# Patient Record
Sex: Female | Born: 1980 | Hispanic: No | Marital: Married | Smoking: Former smoker
Health system: Southern US, Community
[De-identification: ages and names within clinical notes are randomized; demographics above are authoritative.]

## PROBLEM LIST (undated history)

## (undated) DIAGNOSIS — Z9189 Other specified personal risk factors, not elsewhere classified: Secondary | ICD-10-CM

## (undated) DIAGNOSIS — E119 Type 2 diabetes mellitus without complications: Secondary | ICD-10-CM

## (undated) DIAGNOSIS — J45909 Unspecified asthma, uncomplicated: Secondary | ICD-10-CM

## (undated) DIAGNOSIS — M129 Arthropathy, unspecified: Secondary | ICD-10-CM

## (undated) DIAGNOSIS — F32A Depression, unspecified: Secondary | ICD-10-CM

## (undated) DIAGNOSIS — E669 Obesity, unspecified: Secondary | ICD-10-CM

## (undated) DIAGNOSIS — R87619 Unspecified abnormal cytological findings in specimens from cervix uteri: Secondary | ICD-10-CM

## (undated) DIAGNOSIS — N926 Irregular menstruation, unspecified: Secondary | ICD-10-CM

## (undated) DIAGNOSIS — K8020 Calculus of gallbladder without cholecystitis without obstruction: Secondary | ICD-10-CM

## (undated) DIAGNOSIS — E7800 Pure hypercholesterolemia, unspecified: Secondary | ICD-10-CM

## (undated) HISTORY — PX: BACK SURGERY: SHX140

## (undated) HISTORY — DX: Type 2 diabetes mellitus without complications: E11.9

## (undated) HISTORY — DX: Obesity, unspecified: E66.9

## (undated) HISTORY — PX: TONSILLECTOMY: SUR1361

## (undated) HISTORY — DX: Unspecified asthma, uncomplicated: J45.909

## (undated) HISTORY — DX: Other specified personal risk factors, not elsewhere classified: Z91.89

## (undated) HISTORY — DX: Pure hypercholesterolemia, unspecified: E78.00

## (undated) HISTORY — PX: CHOLECYSTECTOMY: SHX55

## (undated) HISTORY — DX: Calculus of gallbladder without cholecystitis without obstruction: K80.20

## (undated) HISTORY — DX: Depression, unspecified: F32.A

## (undated) HISTORY — DX: Arthropathy, unspecified: M12.9

## (undated) HISTORY — PX: INNER EAR SURGERY: SHX679

## (undated) HISTORY — DX: Irregular menstruation, unspecified: N92.6

## (undated) HISTORY — DX: Unspecified abnormal cytological findings in specimens from cervix uteri: R87.619

---

## 2008-02-16 ENCOUNTER — Encounter: Payer: Self-pay | Admitting: Endocrinology

## 2008-03-08 DIAGNOSIS — R87619 Unspecified abnormal cytological findings in specimens from cervix uteri: Secondary | ICD-10-CM | POA: Insufficient documentation

## 2008-03-08 DIAGNOSIS — Z9189 Other specified personal risk factors, not elsewhere classified: Secondary | ICD-10-CM

## 2008-03-08 DIAGNOSIS — N926 Irregular menstruation, unspecified: Secondary | ICD-10-CM

## 2008-03-08 HISTORY — DX: Irregular menstruation, unspecified: N92.6

## 2008-03-08 HISTORY — DX: Other specified personal risk factors, not elsewhere classified: Z91.89

## 2008-03-08 HISTORY — DX: Unspecified abnormal cytological findings in specimens from cervix uteri: R87.619

## 2008-03-09 ENCOUNTER — Ambulatory Visit: Payer: Self-pay | Admitting: Endocrinology

## 2008-03-09 DIAGNOSIS — J309 Allergic rhinitis, unspecified: Secondary | ICD-10-CM | POA: Insufficient documentation

## 2008-03-09 DIAGNOSIS — M129 Arthropathy, unspecified: Secondary | ICD-10-CM | POA: Insufficient documentation

## 2008-03-09 DIAGNOSIS — J45909 Unspecified asthma, uncomplicated: Secondary | ICD-10-CM

## 2008-03-09 HISTORY — DX: Arthropathy, unspecified: M12.9

## 2008-03-09 HISTORY — DX: Unspecified asthma, uncomplicated: J45.909

## 2008-03-23 ENCOUNTER — Ambulatory Visit: Payer: Self-pay | Admitting: Endocrinology

## 2008-07-06 ENCOUNTER — Ambulatory Visit (HOSPITAL_COMMUNITY): Admission: RE | Admit: 2008-07-06 | Discharge: 2008-07-06 | Payer: Self-pay | Admitting: Specialist

## 2008-08-03 ENCOUNTER — Ambulatory Visit (HOSPITAL_COMMUNITY): Admission: RE | Admit: 2008-08-03 | Discharge: 2008-08-03 | Payer: Self-pay | Admitting: Specialist

## 2008-08-31 ENCOUNTER — Ambulatory Visit (HOSPITAL_COMMUNITY): Admission: RE | Admit: 2008-08-31 | Discharge: 2008-08-31 | Payer: Self-pay | Admitting: Specialist

## 2008-09-21 ENCOUNTER — Ambulatory Visit (HOSPITAL_COMMUNITY): Admission: RE | Admit: 2008-09-21 | Discharge: 2008-09-21 | Payer: Self-pay | Admitting: Specialist

## 2008-10-14 ENCOUNTER — Ambulatory Visit (HOSPITAL_COMMUNITY): Admission: RE | Admit: 2008-10-14 | Discharge: 2008-10-14 | Payer: Self-pay | Admitting: Specialist

## 2008-12-08 ENCOUNTER — Ambulatory Visit: Payer: Self-pay | Admitting: Endocrinology

## 2008-12-08 DIAGNOSIS — E119 Type 2 diabetes mellitus without complications: Secondary | ICD-10-CM | POA: Insufficient documentation

## 2008-12-08 HISTORY — DX: Type 2 diabetes mellitus without complications: E11.9

## 2009-01-11 ENCOUNTER — Ambulatory Visit: Payer: Self-pay | Admitting: Endocrinology

## 2009-05-02 ENCOUNTER — Ambulatory Visit: Payer: Self-pay | Admitting: Endocrinology

## 2009-08-02 ENCOUNTER — Ambulatory Visit: Payer: Self-pay | Admitting: Endocrinology

## 2009-08-02 LAB — CONVERTED CEMR LAB: Hgb A1c MFr Bld: 9.8 % — ABNORMAL HIGH (ref 4.6–6.5)

## 2009-10-28 ENCOUNTER — Ambulatory Visit: Payer: Self-pay | Admitting: Endocrinology

## 2010-05-01 ENCOUNTER — Encounter: Payer: Self-pay | Admitting: Specialist

## 2010-05-02 ENCOUNTER — Ambulatory Visit: Admit: 2010-05-02 | Payer: Self-pay | Admitting: Endocrinology

## 2010-05-08 ENCOUNTER — Telehealth: Payer: Self-pay | Admitting: Endocrinology

## 2010-05-09 NOTE — Assessment & Plan Note (Signed)
Summary: 3 MOS F/U #/CD   Vital Signs:  Patient profile:   30 year old female Height:      65 inches (165.10 cm) Weight:      261.13 pounds (118.70 kg) O2 Sat:      98 % on Room air Temp:     96.5 degrees F (35.83 degrees C) oral Pulse rate:   80 / minute BP sitting:   120 / 82  (left arm) Cuff size:   large  Vitals Entered By: Josph Macho CMA (May 02, 2009 11:10 AM)  O2 Flow:  Room air CC: 3 MONTH FOLLOW UP/ CF Is Patient Diabetic? Yes   Referring Provider:  Dr. Tressie Ellis, GYN, [place]  CC:  3 MONTH FOLLOW UP/ CF.  History of Present Illness: pt is now 6 mos postpartum.  pt states she feels well in general.  she is not breast-feeding.  no cbg record, but states cbg's are well-controlled, except slightly high in the afternoon.  she says the cost of a medication is a primary concern to her.  she is on oral contraceptives now.  Current Medications (verified): 1)  Zyrtec Allergy 10 Mg Tabs (Cetirizine Hcl) .... Take 1 By Mouth Qd 2)  Freestyle Test  Strp (Glucose Blood) .... Qid 250.03, and Lancets 3)  Metformin Hcl 500 Mg Xr24h-Tab (Metformin Hcl) .... 2 Qam 4)  Sprintec 28 0.25-35 Mg-Mcg Tabs (Norgestimate-Eth Estradiol) .Marland Kitchen.. 1 Daily  Allergies (verified): 1)  ! Bactrim 2)  ! Sulfa 3)  ! Penicillin 4)  ! Barbiturates  Past History:  Past Medical History: DM (ICD-250.00) ARTHRITIS (ICD-716.90) ASTHMA (ICD-493.90) ALLERGIC RHINITIS (ICD-477.9) COLPOSCOPY, HX OF (ICD-V15.89) IRREGULAR MENSTRUAL CYCLE (ICD-626.4) ABNORMAL PAP SMEAR (ICD-795.00) LUNG CANCER, FAMILY HX (ICD-V16.1) DIABETES MELLITUS, FAMILY HX (ICD-V18.0)  Review of Systems  The patient denies hypoglycemia.    Physical Exam  General:  obese.  no distress  Lungs:  Clear to auscultation bilaterally. Normal respiratory effort.  Heart:  Regular rate and rhythm without murmurs or gallops noted. Normal S1,S2.   Additional Exam:   Hemoglobin A1C       [H]  8.4 %     Impression &  Recommendations:  Problem # 1:  DM (ICD-250.00) needs increased rx  Medications Added to Medication List This Visit: 1)  Relion Blood Glucose Test Strp (Glucose blood) .... Two times a day, and lancets 250.00 2)  Glipizide Xl 5 Mg Xr24h-tab (Glipizide) .Marland Kitchen.. 1 each am  Other Orders: TLB-A1C / Hgb A1C (Glycohemoglobin) (83036-A1C) Est. Patient Level III ([address])  Patient Instructions: 1)  pending the test results, please continue the same medications for now 2)  tests are being ordered for you today.  a few days after the test(s), please call [phone] to hear your test results. 3)  return 3 months 4)  check your blood sugar 2 times a day.  vary the time of day when you check, between before the 3 meals, and at bedtime.  also check if you have symptoms of your blood sugar being too high or too low.  please keep a record of the readings and bring it to your next appointment here.  please call us sooner if you are having low blood sugar episodes. 5)  (update: i left message on phone-tree:  increase glipizide-xr to 5 mg each am) Prescriptions: GLIPIZIDE XL 5 MG XR24H-TAB (GLIPIZIDE) 1 each am  #30 x 4   Entered and Authorized by:   Minus Breeding MD   Signed by:  Minus Breeding MD on 05/04/2009   Method used:   Electronically to        [facility]. [address] * (retail)       [address]       [place], [place]  [address]       Ph: [phone]       Fax: [phone]   RxID:   [id number]

## 2010-05-09 NOTE — Assessment & Plan Note (Signed)
Summary: 3 MO ROV /NWS  #   Vital Signs:  Patient profile:   30 year old female Height:      65 inches (165.10 cm) Weight:      261.25 pounds (118.75 kg) O2 Sat:      96 % on Room air Temp:     97.4 degrees F (36.33 degrees C) oral Pulse rate:   89 / minute BP sitting:   128 / 90  (left arm) Cuff size:   large  Vitals Entered By: Josph Macho RMA (August 02, 2009 1:22 PM)  O2 Flow:  Room air CC: 3 month follow up/ CF Is Patient Diabetic? Yes   Referring Provider:  Dr. Tressie Ellis, GYN, [place]  CC:  3 month follow up/ CF.  History of Present Illness: pt is now 6 mos postpartum.  pt states she feels well in general.  she is not breast-feeding.  no cbg record, but states cbg's are improved since last ov.  she says the cost of a medication is a primary concern to her.  she is on oral contraceptives now.  she says she is not considering another pregnancy any time soon.   pt states 1 week of moderate pain at both ears, and associated nasal congestion.  Current Medications (verified): 1)  Metformin Hcl 500 Mg Xr24h-Tab (Metformin Hcl) .... 2 Qam 2)  Sprintec 28 0.25-35 Mg-Mcg Tabs (Norgestimate-Eth Estradiol) .Marland Kitchen.. 1 Daily 3)  Relion Blood Glucose Test  Strp (Glucose Blood) .... Two Times A Day, and Lancets 250.00 4)  Glipizide Xl 5 Mg Xr24h-Tab (Glipizide) .Marland Kitchen.. 1 Each Am  Allergies (verified): 1)  ! Bactrim 2)  ! Sulfa 3)  ! Penicillin 4)  ! Barbiturates  Past History:  Past Medical History: Last updated: 05/02/2009 DM (ICD-250.00) ARTHRITIS (ICD-716.90) ASTHMA (ICD-493.90) ALLERGIC RHINITIS (ICD-477.9) COLPOSCOPY, HX OF (ICD-V15.89) IRREGULAR MENSTRUAL CYCLE (ICD-626.4) ABNORMAL PAP SMEAR (ICD-795.00) LUNG CANCER, FAMILY HX (ICD-V16.1) DIABETES MELLITUS, FAMILY HX (ICD-V18.0)  Review of Systems  The patient denies hypoglycemia and fever.    Physical Exam  General:  obese.   Head:  head: no deformity eyes: no periorbital swelling, no proptosis external  nose and ears are normal mouth: no lesion seen Ears:  both tm's are red Pulses:  dorsalis pedis intact bilat.   Extremities:  no deformity.  no ulcer on the feet.  feet are of normal color and temp.  no edema  Neurologic:  sensation is intact to touch on the feet  Additional Exam:  Hemoglobin A1C       [H]  9.8 %    Impression & Recommendations:  Problem # 1:  DM (ICD-250.00) Assessment Deteriorated  Problem # 2:  uri new  Medications Added to Medication List This Visit: 1)  Doxycycline Hyclate 100 Mg Tabs (Doxycycline hyclate) .Marland Kitchen.. 1 tab two times a day 2)  Glipizide Xl 10 Mg Xr24h-tab (Glipizide) .Marland Kitchen.. 1 once daily 3)  Onglyza 5 Mg Tabs (Saxagliptin hcl) .Marland Kitchen.. 1 each am 4)  Bromocriptine Mesylate 2.5 Mg Tabs (Bromocriptine mesylate) .... 1/2 tab at bedtime  Other Orders: TLB-A1C / Hgb A1C (Glycohemoglobin) (83036-A1C) Est. Patient Level IV ([address])  Patient Instructions: 1)  tests are being ordered for you today.  please call [phone] to hear your test results. 2)  pending the test results, please continue the same medications for now. 3)  here is a discount card for "onglyza," in case today's blood test indicates the need for it. 4)  check your blood sugar 2 times  a day.  vary the time of day when you check, between before the 3 meals, and at bedtime.  also check if you have symptoms of your blood sugar being too high or too low.  please keep a record of the readings and bring it to your next appointment here.  please call us sooner if you are having low blood sugar episodes. 5)  Please schedule a follow-up appointment in 3 months. 6)  doxycycline 100 mg two times a day 7)  take loratadine-d (non-prescription) as needed for congestion 8)  (update: i left message on phone-tree:  increase glipizide-xl to 10 mg each am.  add onglyza 5 mg qam.  add bromocriptine 1/2 of 2.5 mg at bedtime). Prescriptions: BROMOCRIPTINE MESYLATE 2.5 MG TABS (BROMOCRIPTINE MESYLATE) 1/2 tab at bedtime   #15 x 11   Entered and Authorized by:   Minus Breeding MD   Signed by:   Minus Breeding MD on 08/02/2009   Method used:   Electronically to        [facility]. [address] * (retail)       [address]       [place], [place]  [address]       Ph: [phone]       Fax: [phone]   RxID:   [id number] ONGLYZA 5 MG TABS (SAXAGLIPTIN HCL) 1 each am  #30 x 11   Entered and Authorized by:   Minus Breeding MD   Signed by:   Minus Breeding MD on 08/02/2009   Method used:   Electronically to        [facility]. [address] * (retail)       [address]       [place], [place]  [address]       Ph: [phone]       Fax: [phone]   RxID:   [id number] GLIPIZIDE XL 10 MG XR24H-TAB (GLIPIZIDE) 1 once daily  #30 x 11   Entered and Authorized by:   Minus Breeding MD   Signed by:   Minus Breeding MD on 08/02/2009   Method used:   Electronically to        [facility]. [address] * (retail)       [address]       [place], [place]  [address]       Ph: [phone]       Fax: [phone]   RxID:   [id number] DOXYCYCLINE HYCLATE 100 MG TABS (DOXYCYCLINE HYCLATE) 1 tab two times a day  #14 x 0   Entered and Authorized by:   Minus Breeding MD   Signed by:   Minus Breeding MD on 08/02/2009   Method used:   Electronically to        [facility]. [address] * (retail)       [address]       [place], [place]  [address]       Ph: [phone]       Fax: [phone]   RxID:   [id number]

## 2010-05-09 NOTE — Assessment & Plan Note (Signed)
Summary: 3 MO ROV /NWS   Vital Signs:  Patient profile:   30 year old female Height:      65 inches (165.10 cm) Weight:      263.50 pounds (119.77 kg) BMI:     44.01 O2 Sat:      97 % on Room air Temp:     97.7 degrees F (36.50 degrees C) oral Pulse rate:   75 / minute BP sitting:   128 / 78  (left arm) Cuff size:   large  Vitals Entered By: Brenton Grills MA (October 28, 2009 3:16 PM)  O2 Flow:  Room air CC: 3 mo F/U appt/aj Is Patient Diabetic? Yes   Referring Provider:  Dr. Tressie Ellis, GYN, [place]  CC:  3 mo F/U appt/aj.  History of Present Illness: pt states she feels well in general, except for fatigue.  no cbg record, but states cbg's vary from 140-240.  it is in general higher in am than later in the day.  she say her diet and exercise are good.  she feels the meds are making her sleepy.    Current Medications (verified): 1)  Metformin Hcl 500 Mg Xr24h-Tab (Metformin Hcl) .... 2 Qam 2)  Sprintec 28 0.25-35 Mg-Mcg Tabs (Norgestimate-Eth Estradiol) .Marland Kitchen.. 1 Daily 3)  Relion Blood Glucose Test  Strp (Glucose Blood) .... Two Times A Day, and Lancets 250.00 4)  Doxycycline Hyclate 100 Mg Tabs (Doxycycline Hyclate) .Marland Kitchen.. 1 Tab Two Times A Day 5)  Glipizide Xl 10 Mg Xr24h-Tab (Glipizide) .Marland Kitchen.. 1 Once Daily 6)  Onglyza 5 Mg Tabs (Saxagliptin Hcl) .Marland Kitchen.. 1 Each Am 7)  Bromocriptine Mesylate 2.5 Mg Tabs (Bromocriptine Mesylate) .... 1/2 Tab At Bedtime  Allergies (verified): 1)  ! Bactrim 2)  ! Sulfa 3)  ! Penicillin 4)  ! Barbiturates  Past History:  Past Medical History: Last updated: 05/02/2009 DM (ICD-250.00) ARTHRITIS (ICD-716.90) ASTHMA (ICD-493.90) ALLERGIC RHINITIS (ICD-477.9) COLPOSCOPY, HX OF (ICD-V15.89) IRREGULAR MENSTRUAL CYCLE (ICD-626.4) ABNORMAL PAP SMEAR (ICD-795.00) LUNG CANCER, FAMILY HX (ICD-V16.1) DIABETES MELLITUS, FAMILY HX (ICD-V18.0)  Review of Systems  The patient denies hypoglycemia.    Physical Exam  General:  morbidly obese.  no  distress  Neck:  Supple without thyroid enlargement or tenderness.  Additional Exam:  Hemoglobin A1C       [H]  8.8 %     Impression & Recommendations:  Problem # 1:  DIABETES MELLITUS, FAMILY HX (ICD-V18.0) Assessment Improved  Medications Added to Medication List This Visit: 1)  Metformin Hcl 500 Mg Xr24h-tab (Metformin hcl) .... 2 tabs two times a day 2)  Bromocriptine Mesylate 2.5 Mg Tabs (Bromocriptine mesylate) .Marland Kitchen.. 1 tab at bedtime  Other Orders: TLB-A1C / Hgb A1C (Glycohemoglobin) (83036-A1C) Est. Patient Level III ([address])  Patient Instructions: 1)  blood tests are being ordered for you today.  please call [phone] to hear your test results. 2)  pending the test results, please: 3)  increase bromocriptine to 2.5 mg at bedtime 4)  increase metformin to 2x500 mg two times a day. 5)  based on the test result, it is possible that another medication called "actos"  would be needed to get your blood sugar in a good range. 6)  check your blood sugar 2 times a day.  vary the time of day when you check, between before the 3 meals, and at bedtime.  also check if you have symptoms of your blood sugar being too high or too low.  please keep a record of the readings  and bring it to your next appointment here.  please call us sooner if you are having low blood sugar episodes. Prescriptions: BROMOCRIPTINE MESYLATE 2.5 MG TABS (BROMOCRIPTINE MESYLATE) 1 tab at bedtime  #30 x 11   Entered and Authorized by:   Minus Breeding MD   Signed by:   Minus Breeding MD on 10/28/2009   Method used:   Electronically to        [facility]. [address] * (retail)       [address]       [place], [place]  [address]       Ph: [phone]       Fax: [phone]   RxID:   [id number] METFORMIN HCL 500 MG XR24H-TAB (METFORMIN HCL) 2 tabs two times a day  #120 x 11   Entered and Authorized by:   Minus Breeding MD   Signed by:   Minus Breeding MD on 10/28/2009   Method used:   Electronically to         [facility]. [address] * (retail)       [address]       [place], [place]  [address]       Ph: [phone]       Fax: [phone]   RxID:   [id number]

## 2010-05-17 NOTE — Progress Notes (Signed)
Summary: OV due  Phone Note Outgoing Call Call back at [facility] Phone [phone]   Call placed by: Brenton Grills CMA Duncan Dull),  May 08, 2010 4:42 PM Call placed to: Patient Details for Reason: OV due Summary of Call: Per MD, pt is due for F/U OV. Last OV 10/2009. Left detailed message for pt to schedule OV with SAE.

## 2010-07-05 ENCOUNTER — Encounter: Payer: Self-pay | Admitting: Endocrinology

## 2010-07-05 ENCOUNTER — Ambulatory Visit (INDEPENDENT_AMBULATORY_CARE_PROVIDER_SITE_OTHER): Payer: BC Managed Care – PPO | Admitting: Endocrinology

## 2010-07-05 ENCOUNTER — Other Ambulatory Visit (INDEPENDENT_AMBULATORY_CARE_PROVIDER_SITE_OTHER): Payer: BC Managed Care – PPO

## 2010-07-05 VITALS — BP 122/76 | HR 92 | Temp 98.0°F | Ht 65.0 in | Wt 263.6 lb

## 2010-07-05 DIAGNOSIS — E119 Type 2 diabetes mellitus without complications: Secondary | ICD-10-CM

## 2010-07-05 MED ORDER — DOXYCYCLINE HYCLATE 100 MG PO TABS: 100.0000 mg | ORAL_TABLET | Freq: Two times a day (BID) | ORAL | Status: AC

## 2010-07-05 NOTE — Patient Instructions (Addendum)
blood tests are being ordered for you today.  please call [phone] to hear your test results. If today's blood test is high, options are addition of "actos," addition of "welchol," or changing onglyza to "victoza." You should consider weight-loss surgery.  Please call if you want to pursue this.   i have sent a prescription for an antibiotic to [facility] in [place].   Also, you should consider "loratadine-d" (non-prescription) for congestion.

## 2010-07-05 NOTE — Progress Notes (Signed)
  Subjective:    Patient ID: Judith Ray, female    DOB: 06-Apr-1981, 30 y.o.   MRN: [id number]  HPI Pt states 1 week of moderate pain in the left ear, and assoc nasal congestion.   pt says her cbg's continue to be in the 200's.  She stopped the parlodel, because she understands this would offset the contraceptive effect of her birth control pills.   Past Medical History  Diagnosis Date  . DM 12/08/2008  . ASTHMA 03/09/2008  . Irregular menstrual cycle 03/08/2008  . ARTHRITIS 03/09/2008  . ABNORMAL PAP SMEAR 03/08/2008  . COLPOSCOPY, HX OF 03/08/2008   Past Surgical History  Procedure Date  . Tonsillectomy   . Cholecystectomy     reports that she quit smoking about 10 years ago. She does not have any smokeless tobacco history on file. She reports that she does not drink alcohol or use illicit drugs. family history includes Diabetes in her mother. Allergies  Allergen Reactions  . Amoxicillin   . Azithromycin   . Barbiturates   . Penicillins   . Sulfamethoxazole W/Trimethoprim   . Sulfonamide Derivatives     Review of Systems Denies weight change and fever.    Objective:   Physical Exam GENERAL: no distress. HEAD: head: no deformity eyes: no periorbital swelling, no proptosis external nose and ears are normal mouth: no lesion seen Tm's:  Slight red on the left, and normal on the right.   Pulses: dorsalis pedis intact bilat.   Feet: no deformity.  no ulcer on the feet.  feet are of normal color and temp.  no edema Neuro: sensation is intact to touch on the feet.      Lab Results  Component Value Date   HGBA1C 8.5* 07/05/2010      Assessment & Plan:  Dm.  needs increased rx Glenford Peers, new problem

## 2010-08-07 ENCOUNTER — Other Ambulatory Visit: Payer: Self-pay | Admitting: Endocrinology

## 2010-08-10 ENCOUNTER — Other Ambulatory Visit: Payer: Self-pay | Admitting: Endocrinology

## 2010-08-10 MED ORDER — SAXAGLIPTIN HCL 5 MG PO TABS: ORAL_TABLET | ORAL | Status: DC

## 2010-08-10 MED ORDER — GLIPIZIDE ER 10 MG PO TB24: ORAL_TABLET | ORAL | Status: DC

## 2010-08-10 NOTE — Telephone Encounter (Signed)
R'cd fax from [facility] in [place] for refill of pt's Onglyza and Glipizide rx.  Last OV-07/05/2010  Last Filled-07/07/2010

## 2010-09-02 IMAGING — US US OB FOLLOW-UP
1 series · 18 of 28 positions shown · non-contrast
Comparison: none

OBSTETRICAL ULTRASOUND:
 This ultrasound was performed in The [HOSPITAL], and the [facility] report will be stored to [REDACTED] PACS.

[Series 1: us ob follow-up · 18 of 37 slices shown]
[im 1/37]
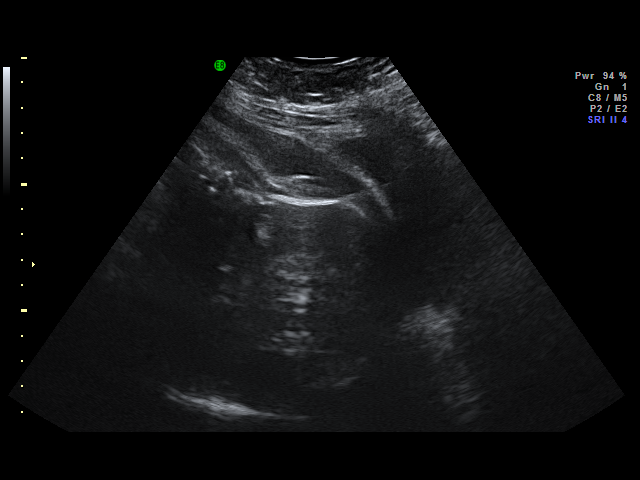
[im 3/37]
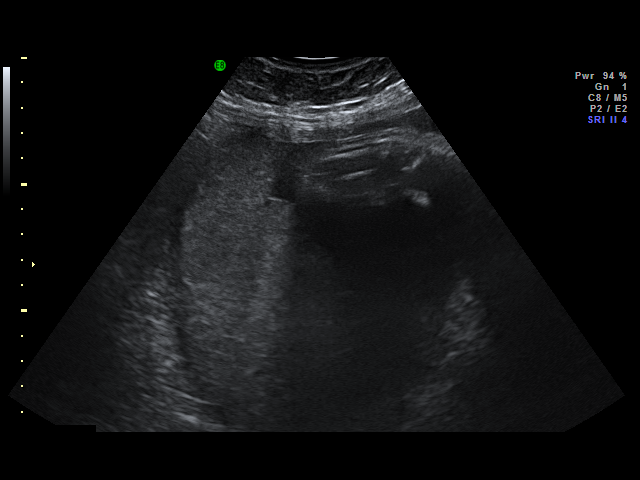
[im 5/37]
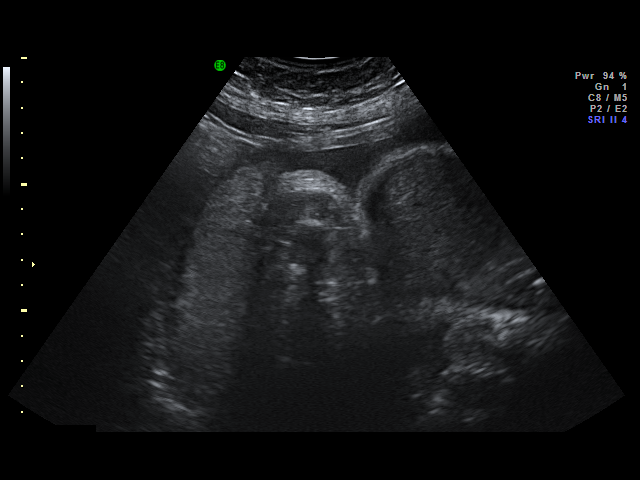
[im 7/37]
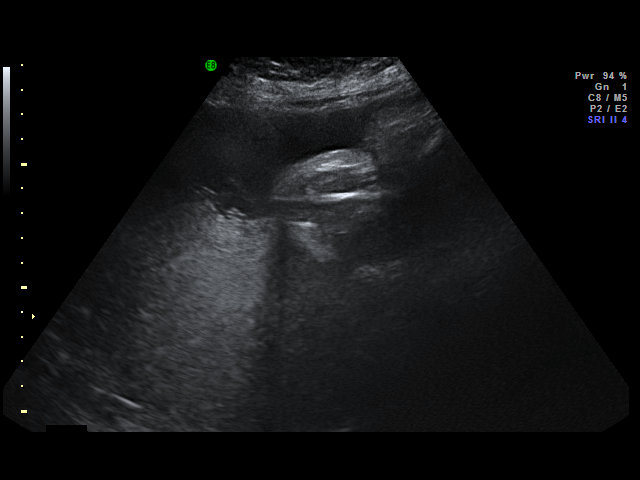
[im 10/37]
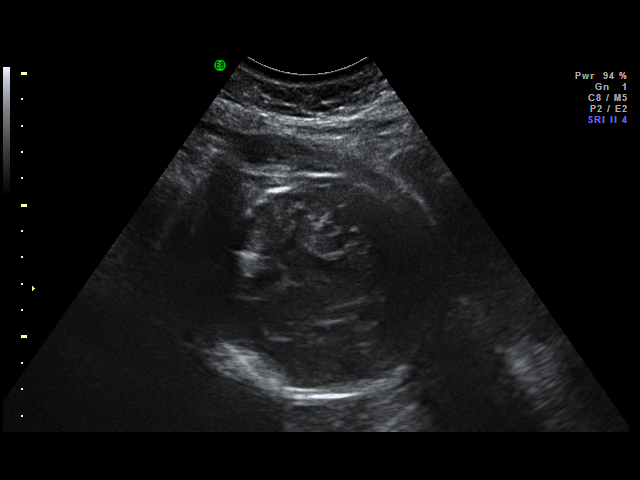
[im 11/37]
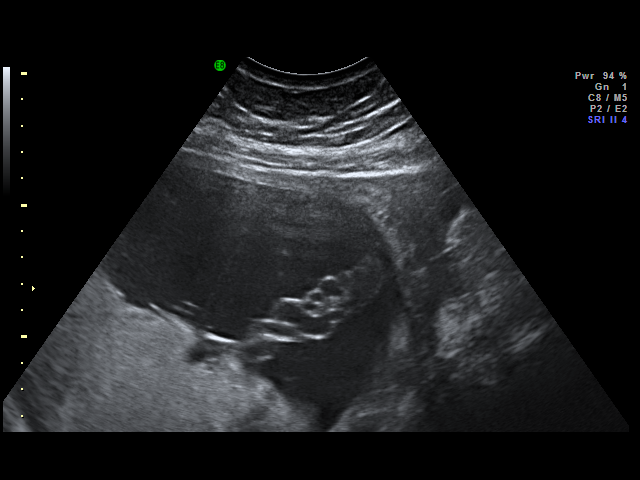
[im 14/37]
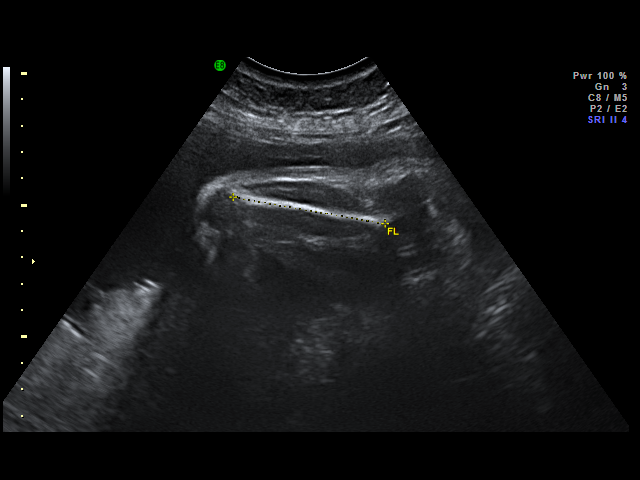
[im 15/37]
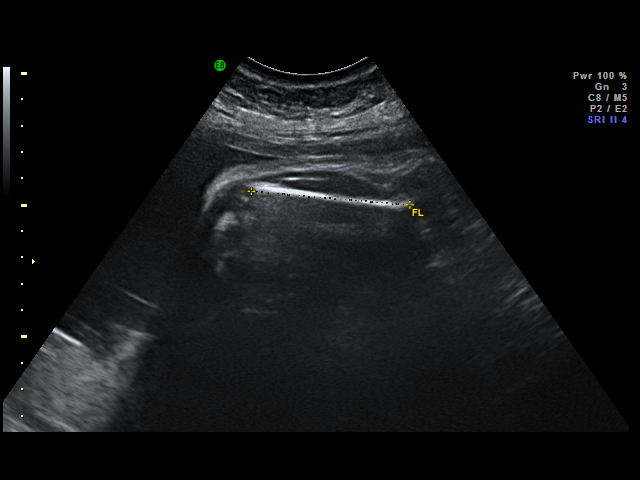
[im 18/37]
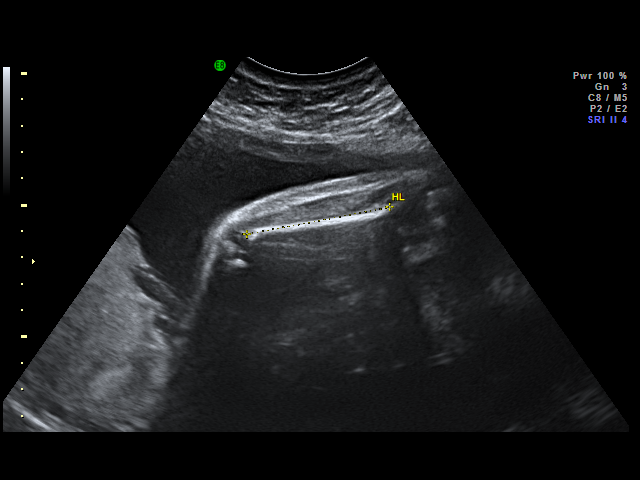
[im 19/37]
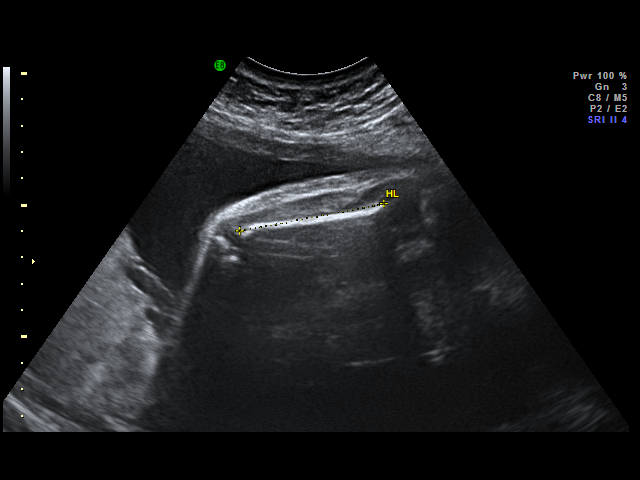
[im 22/37]
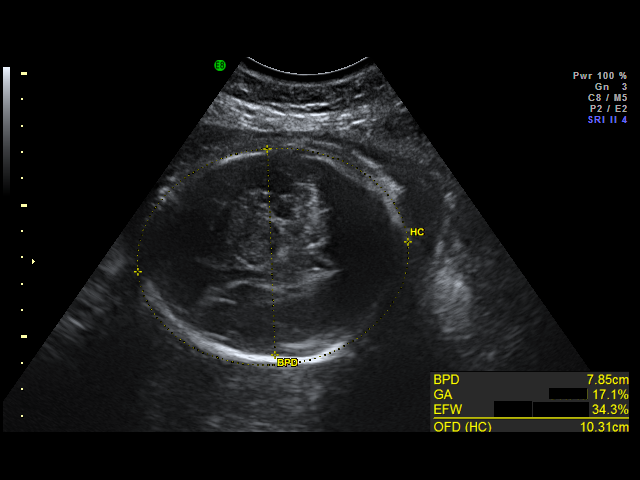
[im 23/37]
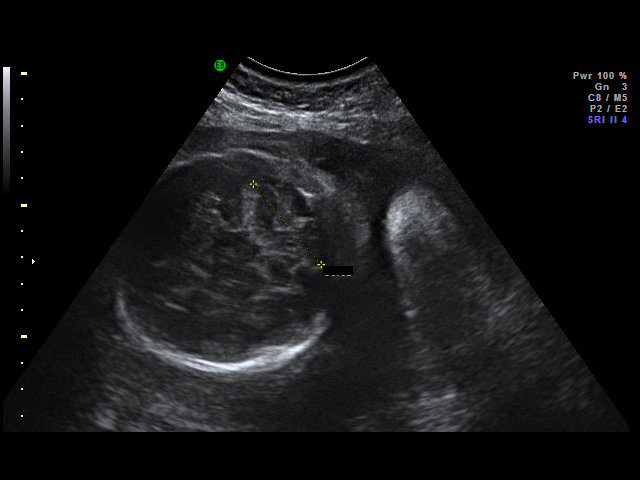
[im 26/37]
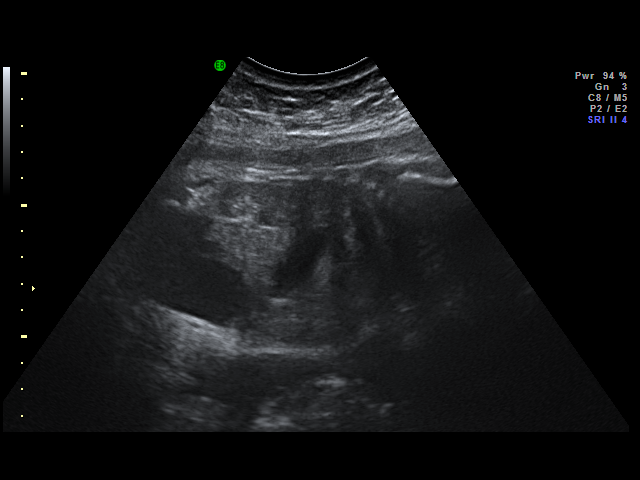
[im 29/37]
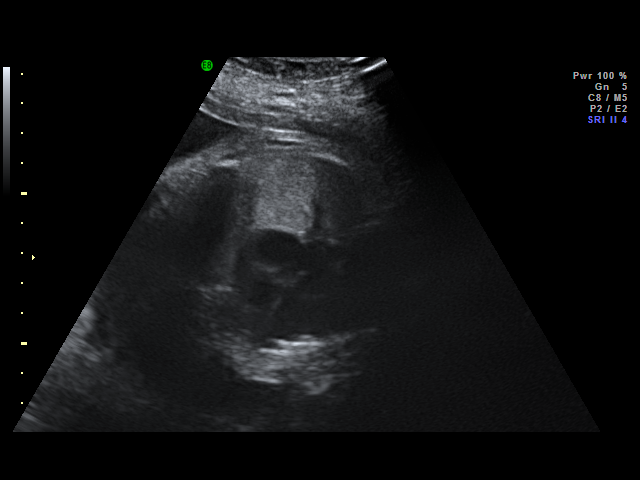
[im 30/37]
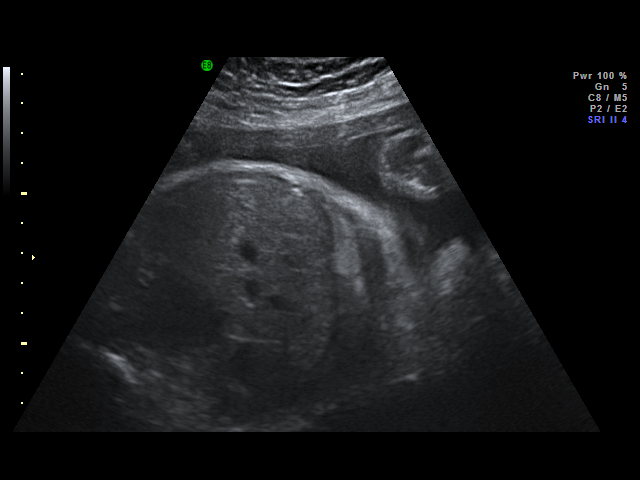
[im 33/37]
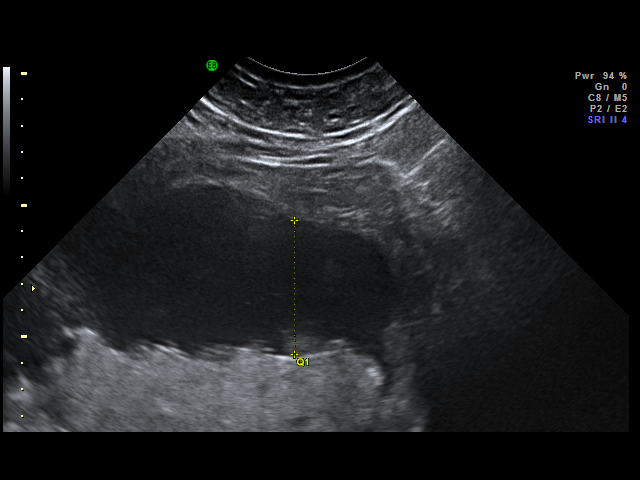
[im 34/37]
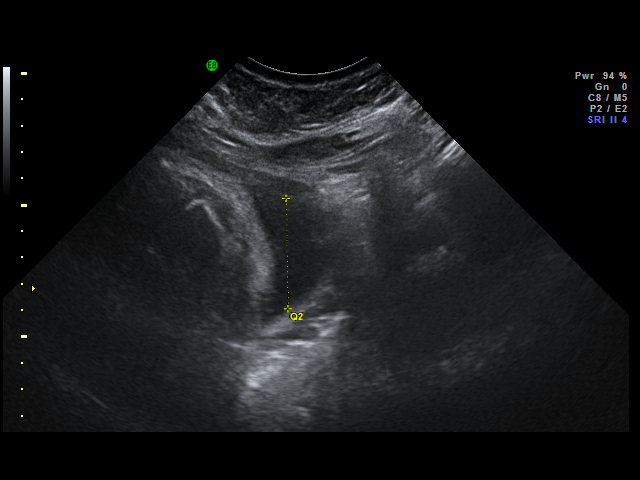
[im 37/37]
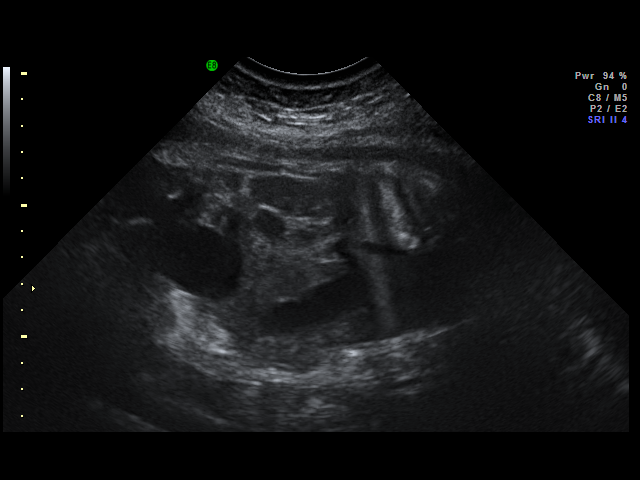

[18 of 28 positions shown; findings below may reference images not displayed]

IMPRESSION: [facility] has also been faxed to the ordering physician.

## 2010-11-06 ENCOUNTER — Other Ambulatory Visit: Payer: Self-pay | Admitting: Endocrinology

## 2011-02-05 ENCOUNTER — Other Ambulatory Visit: Payer: Self-pay | Admitting: Endocrinology

## 2011-02-06 ENCOUNTER — Telehealth: Payer: Self-pay

## 2011-02-06 NOTE — Telephone Encounter (Signed)
Pharmacy called requesting clarification on pt's Glipizide 10 mg. Should she be on both the regular and XR. Pt jas told pharmacy that she takes one in the morning and one in the evening per SAE's instruction, please advise.

## 2011-02-06 NOTE — Telephone Encounter (Signed)
Pharmacy informed. Attempt to contact patient to inform-no answer, no answering machine.

## 2011-02-06 NOTE — Telephone Encounter (Signed)
Done

## 2011-02-06 NOTE — Telephone Encounter (Signed)
Take just xr, not the immediate release

## 2011-05-04 ENCOUNTER — Other Ambulatory Visit: Payer: Self-pay | Admitting: Endocrinology

## 2011-06-10 ENCOUNTER — Other Ambulatory Visit: Payer: Self-pay | Admitting: Endocrinology

## 2011-08-05 ENCOUNTER — Other Ambulatory Visit: Payer: Self-pay | Admitting: Endocrinology

## 2011-11-05 ENCOUNTER — Encounter: Payer: Self-pay | Admitting: Endocrinology

## 2011-11-05 ENCOUNTER — Other Ambulatory Visit (INDEPENDENT_AMBULATORY_CARE_PROVIDER_SITE_OTHER): Payer: BC Managed Care – PPO

## 2011-11-05 ENCOUNTER — Ambulatory Visit (INDEPENDENT_AMBULATORY_CARE_PROVIDER_SITE_OTHER): Payer: BC Managed Care – PPO | Admitting: Endocrinology

## 2011-11-05 VITALS — BP 142/80 | HR 78 | Temp 97.2°F | Ht 67.0 in | Wt 247.2 lb

## 2011-11-05 DIAGNOSIS — E119 Type 2 diabetes mellitus without complications: Secondary | ICD-10-CM

## 2011-11-05 DIAGNOSIS — Z79899 Other long term (current) drug therapy: Secondary | ICD-10-CM | POA: Insufficient documentation

## 2011-11-05 DIAGNOSIS — Z0000 Encounter for general adult medical examination without abnormal findings: Secondary | ICD-10-CM | POA: Insufficient documentation

## 2011-11-05 DIAGNOSIS — M129 Arthropathy, unspecified: Secondary | ICD-10-CM

## 2011-11-05 LAB — CBC WITH DIFFERENTIAL/PLATELET
Basophils Relative: 0.4 % (ref 0.0–3.0)
Eosinophils Relative: 3.1 % (ref 0.0–5.0)
Lymphocytes Relative: 30.2 % (ref 12.0–46.0)
Neutrophils Relative %: 60.9 % (ref 43.0–77.0)
Platelets: 296 10*3/uL (ref 150.0–400.0)
RBC: 4.58 Mil/uL (ref 3.87–5.11)
WBC: 11.9 10*3/uL — ABNORMAL HIGH (ref 4.5–10.5)

## 2011-11-05 LAB — URINALYSIS, ROUTINE W REFLEX MICROSCOPIC
Ketones, ur: NEGATIVE
Urine Glucose: 250
pH: 6 (ref 5.0–8.0)

## 2011-11-05 LAB — LIPID PANEL
Cholesterol: 184 mg/dL (ref 0–200)
Total CHOL/HDL Ratio: 4
Triglycerides: 263 mg/dL — ABNORMAL HIGH (ref 0.0–149.0)

## 2011-11-05 LAB — BASIC METABOLIC PANEL
Chloride: 100 mEq/L (ref 96–112)
Creatinine, Ser: 0.6 mg/dL (ref 0.4–1.2)
GFR: 122.09 mL/min (ref >60.00)
Potassium: 4.1 mEq/L (ref 3.5–5.1)

## 2011-11-05 LAB — MICROALBUMIN / CREATININE URINE RATIO
Creatinine,U: 323.1 mg/dL
Microalb, Ur: 2.3 mg/dL — ABNORMAL HIGH (ref 0.0–1.9)

## 2011-11-05 LAB — TSH: TSH: 5.08 u[IU]/mL (ref 0.35–5.50)

## 2011-11-05 LAB — HEPATIC FUNCTION PANEL
ALT: 56 U/L — ABNORMAL HIGH (ref 0–35)
Alkaline Phosphatase: 45 U/L (ref 39–117)
Bilirubin, Direct: 0.1 mg/dL (ref 0.0–0.3)
Total Bilirubin: 0.5 mg/dL (ref 0.3–1.2)
Total Protein: 7.1 g/dL (ref 6.0–8.3)

## 2011-11-05 LAB — HEMOGLOBIN A1C: Hgb A1c MFr Bld: 10.4 % — ABNORMAL HIGH (ref 4.6–6.5)

## 2011-11-05 LAB — LDL CHOLESTEROL, DIRECT: Direct LDL: 105.9 mg/dL

## 2011-11-05 MED ORDER — GABAPENTIN 300 MG PO CAPS: 300.0000 mg | ORAL_CAPSULE | Freq: Three times a day (TID) | ORAL | Status: AC

## 2011-11-05 NOTE — Progress Notes (Signed)
Subjective:    Patient ID: Judith Ray, female    DOB: 09-Mar-1981, 31 y.o.   MRN: [id number]  HPI Pt states few mos of intermittent moderate cramps of the feet, and assoc pain.  sxs are worse in the context of exertion, or exposure to cold.   Past Medical History  Diagnosis Date  . DM 12/08/2008  . ASTHMA 03/09/2008  . Irregular menstrual cycle 03/08/2008  . ARTHRITIS 03/09/2008  . ABNORMAL PAP SMEAR 03/08/2008  . COLPOSCOPY, HX OF 03/08/2008    Past Surgical History  Procedure Date  . Tonsillectomy   . Cholecystectomy     History   Social History  . Marital Status: Married    Spouse Name: N/A    Number of Children: N/A  . Years of Education: N/A   Occupational History  . Not on file.   Social History Main Topics  . Smoking status: Former Smoker    Quit date: 07/04/2000  . Smokeless tobacco: Not on file  . Alcohol Use: No  . Drug Use: No  . Sexually Active: Not on file   Other Topics Concern  . Not on file   Social History Narrative  . No narrative on file    Current Outpatient Prescriptions on File Prior to Visit  Medication Sig Dispense Refill  . glipiZIDE (GLUCOTROL XL) 10 MG 24 hr tablet TAKE ONE TABLET BY MOUTH EVERY DAY  30 tablet  5  . glucose blood (RELION GLUCOSE TEST STRIPS) test strip Two times a day dx 250.00       . metFORMIN (GLUCOPHAGE-XR) 500 MG 24 hr tablet TAKE TWO TABLETS BY MOUTH TWICE DAILY  120 tablet  5  . norgestimate-ethinyl estradiol (ORTHO-CYCLEN) 0.25-35 MG-MCG per tablet Take 1 tablet by mouth daily.        . ONGLYZA 5 MG TABS tablet TAKE ONE TABLET BY MOUTH IN THE MORNING  30 tablet  3  . gabapentin (NEURONTIN) 300 MG capsule Take 1 capsule (300 mg total) by mouth 3 (three) times daily.  30 capsule  11    Allergies  Allergen Reactions  . Amoxicillin   . Azithromycin   . Barbiturates   . Penicillins   . Sulfamethoxazole W-Trimethoprim   . Sulfonamide Derivatives     Family History  Problem Relation Age of Onset  . Diabetes  Mother     "diet-controlled"   BP 142/80  Pulse 78  Temp 97.2 F (36.2 C) (Oral)  Ht 5\' 7"  (1.702 m)  Wt 247 lb 4 oz (112.152 kg)  BMI 38.72 kg/m2  SpO2 98%  Review of Systems Denies numbness and edema.      Objective:   Physical Exam VITAL SIGNS:  See vs page. GENERAL: no distress.  Pulses: dorsalis pedis intact bilat.   Feet: no deformity.  no ulcer on the feet.  feet are of normal color and temp.  no edema.   Neuro: sensation is intact to touch on the feet.     Lab Results  Component Value Date   WBC 11.9* 11/05/2011   HGB 13.9 11/05/2011   HCT 41.5 11/05/2011   PLT 296.0 11/05/2011   GLUCOSE 297* 11/05/2011   CHOL 184 11/05/2011   TRIG 263.0* 11/05/2011   HDL 44.90 11/05/2011   LDLDIRECT 105.9 11/05/2011   ALT 56* 11/05/2011   AST 24 11/05/2011   NA 136 11/05/2011   K 4.1 11/05/2011   CL 100 11/05/2011   CREATININE 0.6 11/05/2011   BUN 12 11/05/2011  CO2 29 11/05/2011   TSH 5.08 11/05/2011   HGBA1C 10.4* 11/05/2011   MICROALBUR 2.3* 11/05/2011      Assessment & Plan:  DM.   Poor control elev hepatic transaminase, usually due to nash Dyslipidemia, needs increased rx Foot pain, possibly neuropathic

## 2011-11-05 NOTE — Patient Instructions (Addendum)
blood tests are being requested for you today.  You will receive a letter with results. i have sent a prescription to your pharmacy, for a pill against the pain.   Please come back for a follow-up appointment in 6 months

## 2011-11-06 ENCOUNTER — Encounter: Payer: Self-pay | Admitting: Endocrinology

## 2011-11-07 ENCOUNTER — Telehealth: Payer: Self-pay | Admitting: *Deleted

## 2011-11-07 NOTE — Telephone Encounter (Signed)
Called pt to inform of lab results, pt informed via VM and to callback office with any questions/concerns (letter also mailed to pt).  

## 2011-12-06 ENCOUNTER — Other Ambulatory Visit: Payer: Self-pay | Admitting: Endocrinology

## 2012-01-17 ENCOUNTER — Other Ambulatory Visit: Payer: Self-pay | Admitting: Endocrinology

## 2012-06-19 ENCOUNTER — Other Ambulatory Visit: Payer: Self-pay | Admitting: Endocrinology

## 2012-07-20 ENCOUNTER — Other Ambulatory Visit: Payer: Self-pay | Admitting: Endocrinology

## 2012-07-21 ENCOUNTER — Other Ambulatory Visit: Payer: Self-pay | Admitting: *Deleted

## 2012-07-21 MED ORDER — SAXAGLIPTIN HCL 5 MG PO TABS: 5.0000 mg | ORAL_TABLET | Freq: Every day | ORAL | Status: DC

## 2012-07-21 MED ORDER — GLIPIZIDE ER 10 MG PO TB24: 10.0000 mg | ORAL_TABLET | Freq: Every day | ORAL | Status: DC

## 2012-07-21 MED ORDER — METFORMIN HCL ER 500 MG PO TB24: 500.0000 mg | ORAL_TABLET | Freq: Every day | ORAL | Status: DC

## 2012-10-09 ENCOUNTER — Other Ambulatory Visit: Payer: Self-pay | Admitting: Endocrinology

## 2012-10-29 ENCOUNTER — Other Ambulatory Visit: Payer: Self-pay | Admitting: Endocrinology

## 2013-02-20 ENCOUNTER — Other Ambulatory Visit: Payer: Self-pay | Admitting: Endocrinology

## 2013-03-10 ENCOUNTER — Telehealth: Payer: Self-pay

## 2013-03-10 NOTE — Telephone Encounter (Signed)
Error

## 2014-05-03 ENCOUNTER — Ambulatory Visit: Payer: Self-pay | Admitting: Endocrinology

## 2014-05-12 ENCOUNTER — Ambulatory Visit (INDEPENDENT_AMBULATORY_CARE_PROVIDER_SITE_OTHER): Payer: BLUE CROSS/BLUE SHIELD | Admitting: Endocrinology

## 2014-05-12 ENCOUNTER — Encounter: Payer: Self-pay | Admitting: Endocrinology

## 2014-05-12 VITALS — BP 130/90 | HR 84 | Temp 97.9°F | Ht 67.0 in | Wt 246.0 lb

## 2014-05-12 DIAGNOSIS — E119 Type 2 diabetes mellitus without complications: Secondary | ICD-10-CM

## 2014-05-12 LAB — BASIC METABOLIC PANEL
BUN: 14 mg/dL (ref 6–23)
CALCIUM: 9.6 mg/dL (ref 8.4–10.5)
CO2: 28 mEq/L (ref 19–32)
CREATININE: 0.68 mg/dL (ref 0.40–1.20)
Chloride: 99 mEq/L (ref 96–112)
GFR: 104.12 mL/min (ref >60.00)
GLUCOSE: 362 mg/dL — AB (ref 70–99)
POTASSIUM: 3.9 meq/L (ref 3.5–5.1)
Sodium: 134 mEq/L — ABNORMAL LOW (ref 135–145)

## 2014-05-12 LAB — HEMOGLOBIN A1C: Hgb A1c MFr Bld: 14.5 % — ABNORMAL HIGH (ref 4.6–6.5)

## 2014-05-12 LAB — LIPID PANEL
CHOL/HDL RATIO: 5
Cholesterol: 198 mg/dL (ref 0–200)
HDL: 41.1 mg/dL (ref >39.00)
NONHDL: 156.9
Triglycerides: 338 mg/dL — ABNORMAL HIGH (ref 0.0–149.0)
VLDL: 67.6 mg/dL — ABNORMAL HIGH (ref 0.0–40.0)

## 2014-05-12 LAB — LDL CHOLESTEROL, DIRECT: Direct LDL: 115 mg/dL

## 2014-05-12 LAB — MICROALBUMIN / CREATININE URINE RATIO
CREATININE, U: 56.9 mg/dL
Microalb Creat Ratio: 1.2 mg/g (ref 0.0–30.0)
Microalb, Ur: <0.7 mg/dL (ref 0.0–1.9)

## 2014-05-12 LAB — TSH: TSH: 3.57 u[IU]/mL (ref 0.35–4.50)

## 2014-05-12 MED ORDER — LINAGLIPTIN 5 MG PO TABS: 5.0000 mg | ORAL_TABLET | Freq: Every day | ORAL | Status: DC

## 2014-05-12 MED ORDER — CANAGLIFLOZIN 300 MG PO TABS: 300.0000 mg | ORAL_TABLET | Freq: Every day | ORAL | Status: DC

## 2014-05-12 MED ORDER — METFORMIN HCL ER 500 MG PO TB24: ORAL_TABLET | ORAL | Status: DC

## 2014-05-12 MED ORDER — GLIPIZIDE ER 10 MG PO TB24: 10.0000 mg | ORAL_TABLET | Freq: Every day | ORAL | Status: DC

## 2014-05-12 MED ORDER — ATORVASTATIN CALCIUM 10 MG PO TABS: 10.0000 mg | ORAL_TABLET | Freq: Every day | ORAL | Status: DC

## 2014-05-12 NOTE — Patient Instructions (Addendum)
blood tests are being requested for you today.  We'll let you know about the results. If it is high, we can add additional pills.   check your blood sugar once a day.  vary the time of day when you check, between before the 3 meals, and at bedtime.  also check if you have symptoms of your blood sugar being too high or too low.  please keep a record of the readings and bring it to your next appointment here.  You can write it on any piece of paper.  please call us sooner if your blood sugar goes below 70, or if you have a lot of readings over 200. Please come back for a follow-up appointment in 3 months.   In view of your medical condition, you should avoid pregnancy until we have decided it is safe.

## 2014-05-12 NOTE — Progress Notes (Signed)
Subjective:    Patient ID: Judith Ray, female    DOB: 1981/03/07, 34 y.o.   MRN: [id number]  HPI Pt returns for f/u of diabetes mellitus: DM type: 2 Dx'ed: 2010, during a pregnancy Complications: none Therapy: 3 oral meds DKA: never Severe hypoglycemia: never Pancreatitis: never Other: she has never been on insulin, except during the 2010 pregnancy;  Interval history: no cbg record, but states cbg's are in the 300's.  pt states she feels well in general.  Past Medical History  Diagnosis Date  . DM 12/08/2008  . ASTHMA 03/09/2008  . Irregular menstrual cycle 03/08/2008  . ARTHRITIS 03/09/2008  . ABNORMAL PAP SMEAR 03/08/2008  . COLPOSCOPY, HX OF 03/08/2008    Past Surgical History  Procedure Laterality Date  . Tonsillectomy    . Cholecystectomy      History   Social History  . Marital Status: Married    Spouse Name: N/A    Number of Children: N/A  . Years of Education: N/A   Occupational History  . Not on file.   Social History Main Topics  . Smoking status: Former Smoker    Quit date: 07/04/2000  . Smokeless tobacco: Not on file  . Alcohol Use: No  . Drug Use: No  . Sexual Activity: Not on file   Other Topics Concern  . Not on file   Social History Narrative    Current Outpatient Prescriptions on File Prior to Visit  Medication Sig Dispense Refill  . glucose blood (RELION GLUCOSE TEST STRIPS) test strip Two times a day dx 250.00     . gabapentin (NEURONTIN) 300 MG capsule Take 1 capsule (300 mg total) by mouth 3 (three) times daily. 30 capsule 11   No current facility-administered medications on file prior to visit.    Allergies  Allergen Reactions  . Amoxicillin   . Azithromycin   . Barbiturates   . Penicillins   . Sulfamethoxazole-Trimethoprim   . Sulfonamide Derivatives     Family History  Problem Relation Age of Onset  . Diabetes Mother     "diet-controlled"    BP 130/90 mmHg  Pulse 84  Temp(Src) 97.9 F (36.6 C) (Oral)  Ht   (1.702 m)  Wt 246 lb (111.585 kg)  BMI 38.52 kg/m2  SpO2 99%   Review of Systems She denies hypoglycemia and weight change, blurry vision, headache, chest pain, sob, n/v, urinary frequency, muscle cramps, excessive diaphoresis, depression, cold intolerance, rhinorrhea, and easy bruising.      Objective:   Physical Exam VITAL SIGNS:  See vs page. GENERAL: no distress. Pulses: dorsalis pedis intact bilat.   MSK: no deformity of the feet. CV: no leg edema.  Skin:  no ulcer on the feet.  normal color and temp on the feet. Neuro: sensation is intact to touch on the feet.    Lab Results  Component Value Date   HGBA1C 14.5* 05/12/2014   Lab Results  Component Value Date   CHOL 198 05/12/2014   HDL 41.10 05/12/2014   LDLDIRECT 115.0 05/12/2014   TRIG 338.0* 05/12/2014   CHOLHDL 5 05/12/2014      Assessment & Plan:  Morbid obesity, worse: i advised surgery.  She declines DM: severe exacerbation: she declines insulin. Dyslipidemia: she needs rx.   Patient is advised the following: Patient Instructions  blood tests are being requested for you today.  We'll let you know about the results. If it is high, we can add additional pills.   check  your blood sugar once a day.  vary the time of day when you check, between before the 3 meals, and at bedtime.  also check if you have symptoms of your blood sugar being too high or too low.  please keep a record of the readings and bring it to your next appointment here.  You can write it on any piece of paper.  please call us sooner if your blood sugar goes below 70, or if you have a lot of readings over 200. Please come back for a follow-up appointment in 3 months.   In view of your medical condition, you should avoid pregnancy until we have decided it is safe.    i have sent a prescription to your pharmacy, for lipitor, and for invokana.

## 2014-08-10 ENCOUNTER — Ambulatory Visit: Payer: BLUE CROSS/BLUE SHIELD | Admitting: Endocrinology

## 2014-12-06 ENCOUNTER — Telehealth: Payer: Self-pay

## 2014-12-06 NOTE — Telephone Encounter (Signed)
Pt number on file is not working at this time.

## 2015-05-19 ENCOUNTER — Other Ambulatory Visit: Payer: Self-pay | Admitting: Endocrinology

## 2015-05-24 ENCOUNTER — Other Ambulatory Visit: Payer: Self-pay

## 2015-05-24 DIAGNOSIS — E119 Type 2 diabetes mellitus without complications: Secondary | ICD-10-CM

## 2015-05-24 MED ORDER — GLIPIZIDE ER 10 MG PO TB24: 10.0000 mg | ORAL_TABLET | Freq: Every day | ORAL | Status: DC

## 2015-08-21 ENCOUNTER — Other Ambulatory Visit: Payer: Self-pay | Admitting: Endocrinology

## 2015-09-26 ENCOUNTER — Other Ambulatory Visit: Payer: Self-pay | Admitting: Endocrinology

## 2015-10-26 ENCOUNTER — Other Ambulatory Visit: Payer: Self-pay | Admitting: Endocrinology

## 2015-11-30 ENCOUNTER — Other Ambulatory Visit: Payer: Self-pay | Admitting: Endocrinology

## 2015-11-30 NOTE — Telephone Encounter (Signed)
Please refill each x 1 Ov is due 

## 2015-12-30 ENCOUNTER — Other Ambulatory Visit: Payer: Self-pay | Admitting: Endocrinology

## 2015-12-31 NOTE — Telephone Encounter (Signed)
Please refill x 1 Ov is due  

## 2016-01-02 ENCOUNTER — Telehealth: Payer: Self-pay | Admitting: Endocrinology

## 2016-01-02 MED ORDER — METFORMIN HCL ER 500 MG PO TB24: 1000.0000 mg | ORAL_TABLET | Freq: Two times a day (BID) | ORAL | 0 refills | Status: DC

## 2016-01-02 NOTE — Telephone Encounter (Signed)
Pharmacy notified.

## 2016-01-02 NOTE — Telephone Encounter (Signed)
Either is ok.

## 2016-01-02 NOTE — Telephone Encounter (Signed)
Pharmacy is calling for the clarification of whether the XR is correct or if the ER is correct she has been on ER for a while Please advise [phone]

## 2016-01-02 NOTE — Telephone Encounter (Signed)
See message and please advise, Thanks!  

## 2016-02-08 ENCOUNTER — Other Ambulatory Visit: Payer: Self-pay | Admitting: Endocrinology

## 2016-02-08 NOTE — Telephone Encounter (Signed)
Please refill x 1 Ov is due  

## 2016-03-07 ENCOUNTER — Other Ambulatory Visit: Payer: Self-pay | Admitting: Endocrinology

## 2016-03-08 NOTE — Telephone Encounter (Signed)
Please refill x 3 months Ov is due 

## 2016-05-23 ENCOUNTER — Other Ambulatory Visit: Payer: Self-pay | Admitting: Endocrinology

## 2016-05-23 DIAGNOSIS — E119 Type 2 diabetes mellitus without complications: Secondary | ICD-10-CM

## 2016-05-28 ENCOUNTER — Other Ambulatory Visit: Payer: Self-pay | Admitting: Endocrinology

## 2016-05-28 DIAGNOSIS — E119 Type 2 diabetes mellitus without complications: Secondary | ICD-10-CM

## 2016-06-04 ENCOUNTER — Other Ambulatory Visit: Payer: Self-pay

## 2016-06-14 ENCOUNTER — Ambulatory Visit (INDEPENDENT_AMBULATORY_CARE_PROVIDER_SITE_OTHER): Payer: BLUE CROSS/BLUE SHIELD | Admitting: Endocrinology

## 2016-06-14 ENCOUNTER — Encounter: Payer: Self-pay | Admitting: Endocrinology

## 2016-06-14 VITALS — BP 132/86 | HR 82 | Ht 67.0 in | Wt 242.0 lb

## 2016-06-14 DIAGNOSIS — E119 Type 2 diabetes mellitus without complications: Secondary | ICD-10-CM | POA: Diagnosis not present

## 2016-06-14 LAB — POCT GLYCOSYLATED HEMOGLOBIN (HGB A1C): Hemoglobin A1C: 11.2

## 2016-06-14 MED ORDER — CLOTRIMAZOLE-BETAMETHASONE 1-0.05 % EX CREA: 1.0000 "application " | TOPICAL_CREAM | Freq: Every day | CUTANEOUS | 3 refills | Status: DC

## 2016-06-14 MED ORDER — INSULIN NPH ISOPHANE & REGULAR (70-30) 100 UNIT/ML ~~LOC~~ SUSP: SUBCUTANEOUS | 11 refills | Status: AC

## 2016-06-14 NOTE — Progress Notes (Signed)
Subjective:    Patient ID: Judith SalvageDonna Ray, female    DOB: 1981/03/26, 36 y.o.   MRN: [id number]  HPI Pt returns for f/u of diabetes mellitus: DM type: 2 Dx'ed: 2010, during a pregnancy.  Complications: none Therapy: 3 oral meds DKA: never Severe hypoglycemia: never Pancreatitis: never. Other: she has never been on insulin, except during the 2010 pregnancy.   Interval history: no cbg record, but states cbg's vary from 122-500.  pt reports fatigue.  She takes meds inconsistently.   Past Medical History:  Diagnosis Date  . ABNORMAL PAP SMEAR 03/08/2008  . ARTHRITIS 03/09/2008  . ASTHMA 03/09/2008  . COLPOSCOPY, HX OF 03/08/2008  . DM 12/08/2008  . Irregular menstrual cycle 03/08/2008    Past Surgical History:  Procedure Laterality Date  . CHOLECYSTECTOMY    . TONSILLECTOMY      Social History   Social History  . Marital status: Married    Spouse name: N/A  . Number of children: N/A  . Years of education: N/A   Occupational History  . Not on file.   Social History Main Topics  . Smoking status: Former Smoker    Quit date: 07/04/2000  . Smokeless tobacco: Never Used  . Alcohol use No  . Drug use: No  . Sexual activity: Not on file   Other Topics Concern  . Not on file   Social History Narrative  . No narrative on file    Current Outpatient Prescriptions on File Prior to Visit  Medication Sig Dispense Refill  . atorvastatin (LIPITOR) 10 MG tablet TAKE ONE TABLET BY MOUTH ONCE DAILY 90 tablet 0  . CAMRESE 0.15-0.03 &0.01 MG tablet     . glucose blood (RELION GLUCOSE TEST STRIPS) test strip Two times a day dx 250.00     . gabapentin (NEURONTIN) 300 MG capsule Take 1 capsule (300 mg total) by mouth 3 (three) times daily. 30 capsule 11   No current facility-administered medications on file prior to visit.     Allergies  Allergen Reactions  . Amoxicillin   . Azithromycin   . Barbiturates   . Penicillins   . Sulfamethoxazole-Trimethoprim   . Sulfonamide  Derivatives     Family History  Problem Relation Age of Onset  . Diabetes Mother     "diet-controlled"    BP 132/86   Pulse 82   Ht 5\' 7"  (1.702 m)   Wt 242 lb (109.8 kg)   SpO2 97%   BMI 37.90 kg/m    Review of Systems she denies hypoglycemia.     Objective:   Physical Exam VITAL SIGNS:  See vs page GENERAL: no distress Pulses: dorsalis pedis intact bilat.   MSK: no deformity of the feet CV: no leg edema Skin:  no ulcer on the feet.  normal color and temp on the feet.   Neuro: sensation is intact to touch on the feet.    A1c=11.2%    Assessment & Plan:  Type 2 DM, worse: she needs insulin.  We discussed. She chooses BID premixed insulin.   Noncompliance with meds: we'll address this again when pt is on insulin.  Patient is advised the following: Patient Instructions  I have sent a prescription to your pharmacy, for the insulin, to replace the diabetes pills. check your blood sugar twice a day.  vary the time of day when you check, between before the 3 meals, and at bedtime.  also check if you have symptoms of your blood sugar being too  high or too low.  please keep a record of the readings and bring it to your next appointment here (or you can bring the meter itself).  You can write it on any piece of paper.  please call us sooner if your blood sugar goes below 70, or if you have a lot of readings over 200. Please call us next week, to tell us how the blood sugar is doing Please come back for a follow-up appointment in 2 months.

## 2016-06-14 NOTE — Patient Instructions (Addendum)
I have sent a prescription to your pharmacy, for the insulin, to replace the diabetes pills. check your blood sugar twice a day.  vary the time of day when you check, between before the 3 meals, and at bedtime.  also check if you have symptoms of your blood sugar being too high or too low.  please keep a record of the readings and bring it to your next appointment here (or you can bring the meter itself).  You can write it on any piece of paper.  please call us sooner if your blood sugar goes below 70, or if you have a lot of readings over 200. Please call us next week, to tell us how the blood sugar is doing Please come back for a follow-up appointment in 2 months.

## 2020-06-30 DIAGNOSIS — E785 Hyperlipidemia, unspecified: Secondary | ICD-10-CM | POA: Insufficient documentation

## 2020-06-30 DIAGNOSIS — I10 Essential (primary) hypertension: Secondary | ICD-10-CM | POA: Insufficient documentation

## 2020-12-01 DIAGNOSIS — G4730 Sleep apnea, unspecified: Secondary | ICD-10-CM | POA: Insufficient documentation

## 2021-01-16 DIAGNOSIS — G4733 Obstructive sleep apnea (adult) (pediatric): Secondary | ICD-10-CM | POA: Insufficient documentation

## 2021-01-16 DIAGNOSIS — G8929 Other chronic pain: Secondary | ICD-10-CM | POA: Insufficient documentation

## 2021-01-30 DIAGNOSIS — Z9109 Other allergy status, other than to drugs and biological substances: Secondary | ICD-10-CM | POA: Insufficient documentation

## 2021-01-30 DIAGNOSIS — M48062 Spinal stenosis, lumbar region with neurogenic claudication: Secondary | ICD-10-CM | POA: Insufficient documentation

## 2021-01-30 DIAGNOSIS — R053 Chronic cough: Secondary | ICD-10-CM | POA: Insufficient documentation

## 2021-05-11 DIAGNOSIS — R634 Abnormal weight loss: Secondary | ICD-10-CM | POA: Insufficient documentation

## 2021-05-11 DIAGNOSIS — K5909 Other constipation: Secondary | ICD-10-CM | POA: Insufficient documentation

## 2021-08-10 DIAGNOSIS — E1142 Type 2 diabetes mellitus with diabetic polyneuropathy: Secondary | ICD-10-CM | POA: Insufficient documentation

## 2021-11-23 DIAGNOSIS — E039 Hypothyroidism, unspecified: Secondary | ICD-10-CM | POA: Insufficient documentation

## 2021-12-27 DIAGNOSIS — I959 Hypotension, unspecified: Secondary | ICD-10-CM | POA: Insufficient documentation

## 2021-12-27 DIAGNOSIS — J029 Acute pharyngitis, unspecified: Secondary | ICD-10-CM | POA: Insufficient documentation

## 2022-03-27 DIAGNOSIS — F411 Generalized anxiety disorder: Secondary | ICD-10-CM | POA: Insufficient documentation

## 2022-05-17 ENCOUNTER — Encounter: Payer: Self-pay | Admitting: Gastroenterology

## 2022-05-17 DIAGNOSIS — K222 Esophageal obstruction: Secondary | ICD-10-CM | POA: Insufficient documentation

## 2022-06-06 ENCOUNTER — Other Ambulatory Visit: Payer: Self-pay

## 2022-06-06 DIAGNOSIS — M899 Disorder of bone, unspecified: Secondary | ICD-10-CM | POA: Insufficient documentation

## 2022-06-06 DIAGNOSIS — M2550 Pain in unspecified joint: Secondary | ICD-10-CM | POA: Insufficient documentation

## 2022-06-20 NOTE — Progress Notes (Unsigned)
[facility] Consult Note:  History: Judith Ray 06/21/2022  Referring provider: Etter Sjogren, NP  Reason for consult/chief complaint: Dysphagia (Pt is having problems swallowing solids, pt been having problems for about 2 months)   Subjective  HPI: Judith Ray was recently referred to [place] by her primary care provider in [place] for "esophageal stricture".  No office notes, imaging reports or other information accompany that referral.  Soren reports that 2 months of dysphagia to solid food.  For years she had intermittent pill dysphagia, and that may have been what prompted primary care to start her on omeprazole about 2 years ago.  He does not recall having had heartburn or regurgitation at that time and does not have the symptoms now. She has "year-round" allergies with sinus congestion postnasal drip.  For about the last month she has also noticed a change in vocal quality.  She had a purposeful 120 pound weight loss from late 2022 to late 2023 in order to have a back surgery.  This was primarily done through dietary changes, but also roughly coincides with when she started taking the GLP-1 agonist. Denies nausea or vomiting, and says her weight has been stable for months.  Denies altered bowel habits or rectal bleeding.   ROS:  Review of Systems  Constitutional:  Negative for appetite change and unexpected weight change.  HENT:  Negative for mouth sores and voice change.   Eyes:  Negative for pain and redness.  Respiratory:  Negative for cough and shortness of breath.   Cardiovascular:  Negative for chest pain and palpitations.  Genitourinary:  Negative for dysuria and hematuria.  Musculoskeletal:  Positive for back pain. Negative for arthralgias and myalgias.  Skin:  Negative for pallor and rash.  Allergic/Immunologic: Positive for environmental allergies.  Neurological:  Negative for weakness and headaches.  Hematological:  Negative for adenopathy.      Past Medical History: Past Medical History:  Diagnosis Date   ABNORMAL PAP SMEAR 03/08/2008   ARTHRITIS 03/09/2008   ASTHMA 03/09/2008   COLPOSCOPY, HX OF 03/08/2008   Depression    Diabetes ([place])    DM 12/08/2008   Gallstones    Hypercholesteremia    Irregular menstrual cycle 03/08/2008   Obesity      Past Surgical History: Past Surgical History:  Procedure Laterality Date   BACK SURGERY     CHOLECYSTECTOMY     INNER EAR SURGERY     TONSILLECTOMY       Family History: Family History  Problem Relation Age of Onset   Diabetes Mother        "diet-controlled"   Heart disease Father    Colon cancer Neg Hx    Stomach cancer Neg Hx    Esophageal cancer Neg Hx     Social History: Social History   Socioeconomic History   Marital status: Married    Spouse name: Not on file   Number of children: 1   Years of education: Not on file   Highest education level: Not on file  Occupational History   Occupation: Scientist, research (medical)  Tobacco Use   Smoking status: Former    Types: Cigarettes    Quit date: 07/04/2000    Years since quitting: 21.9   Smokeless tobacco: Never  Vaping Use   Vaping Use: Never used  Substance and Sexual Activity   Alcohol use: No   Drug use: No   Sexual activity: Not on file  Other Topics Concern   Not on file  Social History Narrative   Not on file   Social Determinants of Health   Financial Resource Strain: Not on file  Food Insecurity: Not on file  Transportation Needs: Not on file  Physical Activity: Not on file  Stress: Not on file  Social Connections: Not on file    Allergies: Allergies  Allergen Reactions   Amoxicillin    Azithromycin    Barbiturates    Erythromycin Other (See Comments)   Penicillins    Pentobarbital    Sulfamethoxazole-Trimethoprim    Sulfonamide Derivatives Other (See Comments)    Outpatient Meds: Current Outpatient Medications  Medication Sig Dispense Refill   acetaminophen (TYLENOL) 325 MG  tablet Take by mouth.     amLODipine (NORVASC) 5 MG tablet Take by mouth.     atorvastatin (LIPITOR) 10 MG tablet TAKE ONE TABLET BY MOUTH ONCE DAILY 90 tablet 0   atorvastatin (LIPITOR) 40 MG tablet Take 1 tablet by mouth daily.     DULoxetine (CYMBALTA) 60 MG capsule Take 2 capsules by mouth at bedtime.     famotidine (PEPCID) 20 MG tablet Take 1 tablet by mouth every morning.     glimepiride (AMARYL) 4 MG tablet Take by mouth.     glucose blood (RELION GLUCOSE TEST STRIPS) test strip Two times a day dx 250.00      JARDIANCE 10 MG TABS tablet Take 10 mg by mouth daily.     losartan (COZAAR) 100 MG tablet Take 1 tablet by mouth at bedtime.     metFORMIN (GLUCOPHAGE) 1000 MG tablet Take by mouth.     montelukast (SINGULAIR) 10 MG tablet Take 1 tablet by mouth daily.     OMEPRAZOLE PO Take by mouth.     sitaGLIPtin (JANUVIA) 100 MG tablet Take by mouth.     tirzepatide (MOUNJARO) 7.5 MG/0.5ML Pen Inject into the skin.     tiZANidine (ZANAFLEX) 4 MG tablet TAKE 1 TABLET BY MOUTH EVERY 8 HOURS AS NEEDED FOR 30 DAYS     traZODone (DESYREL) 50 MG tablet Take 1 tablet by mouth at bedtime.     budesonide-formoterol (SYMBICORT) 160-4.5 MCG/ACT inhaler 2 puffs, inhale, BID, # 6 g, 5 Refill(s), Pharmacy: [facility] [phone], 170, cm, 02/16/21 8:33:00 EST, Height/Length Measured (Patient not taking: Reported on 06/21/2022)     CAMRESE 0.15-0.03 &0.01 MG tablet  (Patient not taking: Reported on 06/21/2022)     celecoxib (CELEBREX) 200 MG capsule Take by mouth. (Patient not taking: Reported on 06/21/2022)     gabapentin (NEURONTIN) 300 MG capsule Take 1 capsule (300 mg total) by mouth 3 (three) times daily. 30 capsule 11   insulin NPH-regular Human (NOVOLIN 70/30) (70-30) 100 UNIT/ML injection 30 units with breakfast, and 15 units with supper, and syringes 2/day (Patient not taking: Reported on 06/21/2022) 20 mL 11   No current facility-administered medications for this visit.       ___________________________________________________________________ Objective   Exam:  BP 126/76   Pulse 96   Ht '5\' 5"'$  (1.651 m)   BMI 40.27 kg/m  Wt Readings from Last 3 Encounters:  06/14/16 242 lb (109.8 kg)  05/12/14 246 lb (111.6 kg)  11/05/11 247 lb 4 oz (112.2 kg)    General: Well-appearing, not hoarse Eyes: sclera anicteric, no redness ENT: oral mucosa moist without lesions, no cervical or supraclavicular lymphadenopathy CV: Regular without appreciable murmur, no JVD, no peripheral edema good distal pulses Resp: clear to auscultation bilaterally, normal RR and effort noted GI: soft, no tenderness, with  active bowel sounds. No guarding or palpable organomegaly noted. Skin; warm and dry, no rash or jaundice noted Neuro: awake, alert and oriented x 3. Normal gross motor function and fluent speech  No data for review  Assessment: Encounter Diagnosis  Name Primary?   Esophageal dysphagia Yes    2 months of slowly worsening esophageal dysphagia.  No clear pre-existing reflux symptoms.  Could be stricture, less likely neoplasia, less likely achalasia with the reported history.  Plan:  Upper endoscopy in neck several weeks.  She was agreeable after discussion of procedure and risks.  The benefits and risks of the planned procedure were described in detail with the patient or (when appropriate) their health care proxy.  Risks were outlined as including, but not limited to, bleeding, infection, perforation, adverse medication reaction leading to cardiac or pulmonary decompensation, pancreatitis (if ERCP).  The limitation of incomplete mucosal visualization was also discussed.  No guarantees or warranties were given.   Thank you for the courtesy of this consult.  Please call me with any questions or concerns.  Nelida Meuse III  CC: Referring provider noted above

## 2022-06-21 ENCOUNTER — Ambulatory Visit: Payer: BC Managed Care – PPO | Admitting: Gastroenterology

## 2022-06-21 ENCOUNTER — Telehealth: Payer: Self-pay | Admitting: Gastroenterology

## 2022-06-21 ENCOUNTER — Encounter: Payer: Self-pay | Admitting: Gastroenterology

## 2022-06-21 VITALS — BP 126/76 | HR 96 | Ht 65.0 in

## 2022-06-21 DIAGNOSIS — R1319 Other dysphagia: Secondary | ICD-10-CM | POA: Diagnosis not present

## 2022-06-21 NOTE — Telephone Encounter (Signed)
PT has an 840am today and just called to say she went to wrong address and is on the way. I advised she can be no later than 855am

## 2022-06-21 NOTE — Patient Instructions (Signed)
_______________________________________________________  If your blood pressure at your visit was 140/90 or greater, please contact your primary care physician to follow up on this.  _______________________________________________________  If you are age 44 or older, your body mass index should be between 23-30. Your Body mass index is 40.27 kg/m. If this is out of the aforementioned range listed, please consider follow up with your Primary Care Provider.  If you are age 45 or younger, your body mass index should be between 19-25. Your Body mass index is 40.27 kg/m. If this is out of the aformentioned range listed, please consider follow up with your Primary Care Provider.   ________________________________________________________  The [facility] providers would like to encourage you to use [facility] to communicate with providers for non-urgent requests or questions.  Due to long hold times on the telephone, sending your provider a message by [facility] may be a faster and more efficient way to get a response.  Please allow 48 business hours for a response.  Please remember that this is for non-urgent requests.  _______________________________________________________  Judith Ray have been scheduled for an endoscopy. Please follow written instructions given to you at your visit today. If you use inhalers (even only as needed), please bring them with you on the day of your procedure.  Due to recent changes in healthcare laws, you may see the results of your imaging and laboratory studies on MyChart before your provider has had a chance to review them.  We understand that in some cases there may be results that are confusing or concerning to you. Not all laboratory results come back in the same time frame and the provider may be waiting for multiple results in order to interpret others.  Please give [place] 48 hours in order for your provider to thoroughly review all the results before contacting the office for  clarification of your results.   It was a pleasure to see you today!  Thank you for trusting me with your gastrointestinal care!

## 2022-07-01 ENCOUNTER — Encounter: Payer: Self-pay | Admitting: Certified Registered Nurse Anesthetist

## 2022-07-02 ENCOUNTER — Telehealth: Payer: Self-pay | Admitting: Gastroenterology

## 2022-07-02 ENCOUNTER — Encounter: Payer: Self-pay | Admitting: Gastroenterology

## 2022-07-02 ENCOUNTER — Ambulatory Visit (AMBULATORY_SURGERY_CENTER): Payer: BC Managed Care – PPO | Admitting: Gastroenterology

## 2022-07-02 VITALS — BP 130/87 | HR 80 | Temp 96.2°F | Resp 18 | Ht 65.0 in | Wt 205.0 lb

## 2022-07-02 DIAGNOSIS — R634 Abnormal weight loss: Secondary | ICD-10-CM

## 2022-07-02 DIAGNOSIS — K3189 Other diseases of stomach and duodenum: Secondary | ICD-10-CM

## 2022-07-02 DIAGNOSIS — R1319 Other dysphagia: Secondary | ICD-10-CM | POA: Diagnosis not present

## 2022-07-02 MED ORDER — SODIUM CHLORIDE 0.9 % IV SOLN: 500.0000 mL | Freq: Once | INTRAVENOUS | Status: DC

## 2022-07-02 NOTE — Patient Instructions (Addendum)
Please see Dr. Corena Pilgrim procedure report for recommendations.  YOU HAD AN ENDOSCOPIC PROCEDURE TODAY AT [facility]:   Refer to the procedure report that was given to you for any specific questions about what was found during the examination.  If the procedure report does not answer your questions, please call your gastroenterologist to clarify.  If you requested that your care partner not be given the details of your procedure findings, then the procedure report has been included in a sealed envelope for you to review at your convenience later.  YOU SHOULD EXPECT: Some feelings of bloating in the abdomen. Passage of more gas than usual.  Walking can help get rid of the air that was put into your GI tract during the procedure and reduce the bloating. If you had a lower endoscopy (such as a colonoscopy or flexible sigmoidoscopy) you may notice spotting of blood in your stool or on the toilet paper. If you underwent a bowel prep for your procedure, you may not have a normal bowel movement for a few days.  Please Note:  You might notice some irritation and congestion in your nose or some drainage.  This is from the oxygen used during your procedure.  There is no need for concern and it should clear up in a day or so.  SYMPTOMS TO REPORT IMMEDIATELY:   Following upper endoscopy (EGD)  Vomiting of blood or coffee ground material  New chest pain or pain under the shoulder blades  Painful or persistently difficult swallowing  New shortness of breath  Fever of 100F or higher  Black, tarry-looking stools  For urgent or emergent issues, a gastroenterologist can be reached at any hour by calling [phone]. Do not use MyChart messaging for urgent concerns.    DIET:  We do recommend a small meal at first, but then you may proceed to your regular diet.  Drink plenty of fluids but you should avoid alcoholic beverages for 24 hours.  ACTIVITY:  You should plan to take it easy for the  rest of today and you should NOT DRIVE or use heavy machinery until tomorrow (because of the sedation medicines used during the test).    FOLLOW UP: Our staff will call the number listed on your records the next business day following your procedure.  We will call around 7:15- 8:00 am to check on you and address any questions or concerns that you may have regarding the information given to you following your procedure. If we do not reach you, we will leave a message.     If any biopsies were taken you will be contacted by phone or by letter within the next 1-3 weeks.  Please call us at [phone] if you have not heard about the biopsies in 3 weeks.    SIGNATURES/CONFIDENTIALITY: You and/or your care partner have signed paperwork which will be entered into your electronic medical record.  These signatures attest to the fact that that the information above on your After Visit Summary has been reviewed and is understood.  Full responsibility of the confidentiality of this discharge information lies with you and/or your care-partner.

## 2022-07-02 NOTE — Progress Notes (Signed)
Report given to PACU, vss 

## 2022-07-02 NOTE — Progress Notes (Signed)
No changes to clinical history since GI office visit on 06/21/22.  The patient is appropriate for an endoscopic procedure in the ambulatory setting.  - Wilfrid Lund, MD

## 2022-07-02 NOTE — Op Note (Signed)
[facility] Patient Name: Judith Ray Procedure Date: 07/02/2022 8:00 AM MRN: [id number] Endoscopist: Mallie Mussel L. Loletha Carrow , MD, [id number] Age: 42 Referring MD:  Date of Birth: [birth year] Gender: Female Account #: [id number] Procedure:                Upper GI endoscopy Indications:              Esophageal dysphagia                           06/21/22 office consult note for details                           No vomiting                           Patient has been off GLP-1 agonist a week prior to                            this procedure Medicines:                Monitored Anesthesia Care Procedure:                Pre-Anesthesia Assessment:                           - Prior to the procedure, a History and Physical                            was performed, and patient medications and                            allergies were reviewed. The patient's tolerance of                            previous anesthesia was also reviewed. The risks                            and benefits of the procedure and the sedation                            options and risks were discussed with the patient.                            All questions were answered, and informed consent                            was obtained. Prior Anticoagulants: The patient has                            taken no anticoagulant or antiplatelet agents. ASA                            Grade Assessment: II - A patient with mild systemic  disease. After reviewing the risks and benefits,                            the patient was deemed in satisfactory condition to                            undergo the procedure.                           After obtaining informed consent, the endoscope was                            passed under direct vision. Throughout the                            procedure, the patient's blood pressure, pulse, and                            oxygen saturations were monitored  continuously. The                            Olympus Scope [id number] was introduced through the                            mouth, and advanced to the antrum of the stomach.                            The upper GI endoscopy was accomplished without                            difficulty. The patient tolerated the procedure                            well. Scope In: Scope Out: Findings:                 The esophagus was normal.                           There is no endoscopic evidence of Barrett's                            esophagus, esophagitis, hiatal hernia, dilatation,                            mucosal abnormalities or stricture in the entire                            esophagus. Active motility was seen, and there was                            no resistance passing the scope through the EG J.                           A large amount of food (residue) was found  in the                            entire examined stomach. This precluded                            visualization of the gastric mucosa and passage of                            the scope to the duodenum.                           An examination of the duodenum was not performed. Complications:            No immediate complications. Estimated Blood Loss:     Estimated blood loss: none. Impression:               - Normal esophagus.                           - A large amount of food (residue) in the stomach.                            This strongly suggest gastroparesis and perhaps                            additional UGI dysmotility accounting for symptoms.                           - No specimens collected. Recommendation:           - Patient has a contact number available for                            emergencies. The signs and symptoms of potential                            delayed complications were discussed with the                            patient. Return to normal activities tomorrow.                             Written discharge instructions were provided to the                            patient.                           - Resume previous diet.                           - Continue present medications.                           - Primary care records needed regarding last  hemoglobin A1c and EKG for consideration of                            metoclopramide trial.                           Office follow-up with me to be scheduled. Tonjia Parillo L. Loletha Carrow, MD 07/02/2022 8:28:30 AM This report has been signed electronically.

## 2022-07-02 NOTE — Telephone Encounter (Signed)
This patient had an upper endoscopy today for dysphagia.  There was a large amount of retained food in the stomach limiting visualization.  It strongly suggest gastroparesis, which I discussed with the patient and her family after the procedure.  After further consideration, would like to schedule her for a CT scan abdomen and pelvis with oral and IV contrast to rule out any obstructive cause in the small intestine, particularly with the weight loss that she reported since starting Mounjaro.  - HD

## 2022-07-02 NOTE — Progress Notes (Signed)
VS completed by CW   Pt's states no medical or surgical changes since previsit or office visit.  

## 2022-07-02 NOTE — Progress Notes (Signed)
0757 Robinul 0.1 mg IV given due large amount of secretions upon assessment.  MD made aware, vss 

## 2022-07-03 ENCOUNTER — Telehealth: Payer: Self-pay | Admitting: *Deleted

## 2022-07-03 NOTE — Telephone Encounter (Signed)
  Follow up Call-     07/02/2022    7:13 AM  Call back number  Post procedure Call Back phone  # [phone]  Permission to leave phone message Yes     Patient questions:  Do you have a fever, pain , or abdominal swelling? No. Pain Score  0 *  Have you tolerated food without any problems? Yes.    Have you been able to return to your normal activities? Yes.    Do you have any questions about your discharge instructions: Diet   No. Medications  No. Follow up visit  No.  Do you have questions or concerns about your Care? No.  Actions: * If pain score is 4 or above: No action needed, pain <4.

## 2022-07-04 NOTE — Addendum Note (Signed)
Addended by: Yevette Edwards on: 07/04/2022 09:15 AM   Modules accepted: Orders

## 2022-07-04 NOTE — Addendum Note (Signed)
Addended by: Yevette Edwards on: 07/04/2022 11:45 AM   Modules accepted: Orders

## 2022-07-04 NOTE — Telephone Encounter (Signed)
Called and spoke with patient regarding additional recommendations from Dr. Loletha Carrow. Patient is aware that we have placed an order for CT scan and she knows to expect a call from radiology scheduling to set up her CT scan appt. Pt verbalized understanding and had no concerns at the end of the call.   CT order in epic. Secure staff message sent to radiology scheduling to contact patient to set up appt.

## 2022-07-10 NOTE — Telephone Encounter (Signed)
CT scheduled for 07/21/22 at 9 am.

## 2022-07-21 ENCOUNTER — Ambulatory Visit (HOSPITAL_BASED_OUTPATIENT_CLINIC_OR_DEPARTMENT_OTHER): Payer: BC Managed Care – PPO

## 2022-07-27 ENCOUNTER — Other Ambulatory Visit: Payer: Self-pay

## 2022-07-27 DIAGNOSIS — R634 Abnormal weight loss: Secondary | ICD-10-CM

## 2022-07-27 DIAGNOSIS — K3189 Other diseases of stomach and duodenum: Secondary | ICD-10-CM

## 2022-08-30 ENCOUNTER — Other Ambulatory Visit: Payer: BC Managed Care – PPO

## 2022-10-08 ENCOUNTER — Other Ambulatory Visit: Payer: Self-pay

## 2022-10-08 ENCOUNTER — Telehealth: Payer: Self-pay | Admitting: Gastroenterology

## 2022-10-08 DIAGNOSIS — R634 Abnormal weight loss: Secondary | ICD-10-CM

## 2022-10-08 DIAGNOSIS — K3189 Other diseases of stomach and duodenum: Secondary | ICD-10-CM

## 2022-10-08 NOTE — Telephone Encounter (Signed)
Per her request I had the PA team check for authorization. Pt is only in network with [facility]. She is agreeable to having this CT done there. Order will be sent once I have the PA to go with the order.

## 2022-10-08 NOTE — Telephone Encounter (Signed)
Inbound call from patient requesting a call back regarding CT order. States she was originally scheduled in April but had to cancel. Requesting for the order to be sent in [place], [place]. Please advise, thank you.

## 2022-10-18 NOTE — Telephone Encounter (Signed)
As of today, patient has not scheduled her CT.

## 2022-10-19 NOTE — Telephone Encounter (Signed)
CT has been scheduled for 11-08-2022 @ 1:40pm

## 2022-11-07 ENCOUNTER — Telehealth: Payer: Self-pay | Admitting: Gastroenterology

## 2022-11-07 MED ORDER — METOCLOPRAMIDE HCL 5 MG PO TABS: ORAL_TABLET | ORAL | 0 refills | Status: AC

## 2022-11-07 NOTE — Telephone Encounter (Signed)
MyChart message sent to patient with recommendations. Patient reviewed MyChart message.  Reglan RX sent to pharmacy on file.

## 2022-11-07 NOTE — Telephone Encounter (Signed)
Dr. Rhea Belton as DOD AM of 11/07/22 - please advise, thanks.   Dr. Myrtie Neither' patient with history of dysphagia, retained food in stomach at time of last EGD, possible Gastroparesis.   Called and spoke with patient. Pt reports worsening symptoms such as weight loss of 10 lbs in the last 3-4 days. Pt is not tolerating solid foods, only able to keep liquids down at this time. I told pt that Dr. Myrtie Neither ordered the CT scan to look for a cause of her symptoms months ago, but it looks like this exam had been switched to a different facility of her preference. I advised pt to keep CT as scheduled for official recommendations and diagnosis. Pt stopped Mounjaro months ago due to unavailability at the pharmacy, she is now on Ozempic. No other medication changes. Changing to Ozempic did not worsen or improve her symptoms. Pt denies any diarrhea, her last BM was 3 days ago. Pt also does have intermittent abdominal bloating and epigastric pain, she is passing gas. Pt reports that she is still taking Omeprazole and Pepcid. Pt does not have prescription for any anti-emetics. Please advise, thanks.

## 2022-11-07 NOTE — Telephone Encounter (Signed)
She should discuss her GLP-1 medication with her primary care as this may be exacerbating her symptoms Very likely this is gastroparesis Agree with CT scan as recommended by Dr. Myrtie Neither  Trial of metoclopramide 5-10 mg 3 times daily before meals and at bedtime PRN She should stop the medication and let us know if any side effects occur such as dizziness, tremor, headache, etc.  I will include Dr. Myrtie Neither to this phone

## 2022-11-07 NOTE — Addendum Note (Signed)
Addended by: Missy Sabins on: 11/07/2022 10:45 AM   Modules accepted: Orders

## 2022-11-07 NOTE — Telephone Encounter (Signed)
PT is calling to speak to nurse regarding symptoms. She has stomach pain, vomiting acid, no appetite, losing weight. Wants to know options to feeling better. Please advise.

## 2022-11-08 ENCOUNTER — Ambulatory Visit
Admission: RE | Admit: 2022-11-08 | Discharge: 2022-11-08 | Disposition: A | Discharge status: Home / Self Care | Payer: BC Managed Care – PPO | Source: Ambulatory Visit | Attending: Gastroenterology | Admitting: Gastroenterology

## 2022-11-08 DIAGNOSIS — R634 Abnormal weight loss: Secondary | ICD-10-CM

## 2022-11-08 DIAGNOSIS — K3189 Other diseases of stomach and duodenum: Secondary | ICD-10-CM

## 2022-11-08 MED ORDER — IOPAMIDOL (ISOVUE-300) INJECTION 61%
100.0000 mL | Freq: Once | INTRAVENOUS | Status: AC | PRN
  Administered 2022-11-08: 100 mL via INTRAVENOUS

## 2022-11-13 NOTE — Telephone Encounter (Signed)
CT report is available in epic

## 2022-11-13 NOTE — Telephone Encounter (Signed)
Thank you for the note, and I agree with Dr. Lauro Franklin recommendations.  Please keep an eye out for the CT report.  HD

## 2022-11-13 NOTE — Telephone Encounter (Signed)
MyChart message sent to patient with results and recommendations.  F/U appt scheduled on 02/08/23 t 8:20 am with Dr. Myrtie Neither.

## 2022-11-13 NOTE — Telephone Encounter (Signed)
CT scan abdomen and pelvis did not show any bowel obstruction.  I am unable to determine with certainty if this is underlying gastroparesis (delayed stomach emptying), or side effect of her GLP-1 agonist (currently Ozempic)  Primary care records that I obtained after her upper endoscopy showed that her hemoglobin A1c was 6.3 in June 2023 (and an EKG from June 2023 was normal sinus rhythm with a rate of 72 and a QTc of 451)  I am agreeable to the metoclopramide as recommended by Dr. Rhea Belton, but for no longer than 8 weeks, and with the understanding that she needs to speak to her primary care provider this week or next week about stopping the Ozempic or any other GLP-1 agonist medication for that same period of time.  After being off the Ozempic for at least 4 weeks, she can then start tapering down the metoclopramide frequency over 7 to 10 days until she is off it and we can assess for symptom recurrence.  She needs a next available in person clinic visit with me or an APP, and I understand that will probably be at least 2 months from now given our current availability.  HD

## 2023-02-08 ENCOUNTER — Ambulatory Visit (INDEPENDENT_AMBULATORY_CARE_PROVIDER_SITE_OTHER): Payer: BC Managed Care – PPO | Admitting: Gastroenterology

## 2023-02-08 ENCOUNTER — Encounter: Payer: Self-pay | Admitting: Gastroenterology

## 2023-02-08 VITALS — BP 98/70 | HR 88 | Ht 65.0 in | Wt 252.0 lb

## 2023-02-08 DIAGNOSIS — K5909 Other constipation: Secondary | ICD-10-CM

## 2023-02-08 DIAGNOSIS — K3184 Gastroparesis: Secondary | ICD-10-CM

## 2023-02-08 DIAGNOSIS — R6881 Early satiety: Secondary | ICD-10-CM | POA: Diagnosis not present

## 2023-02-08 DIAGNOSIS — R112 Nausea with vomiting, unspecified: Secondary | ICD-10-CM | POA: Diagnosis not present

## 2023-02-08 MED ORDER — MOTEGRITY 2 MG PO TABS: 2.0000 mg | ORAL_TABLET | Freq: Every day | ORAL | 2 refills | Status: DC

## 2023-02-08 NOTE — Progress Notes (Signed)
[facility] Consult/Progess Note:  History: Judith Ray 02/08/2023  Referring provider: Daryel Gerald, FNP  Reason for consult/chief complaint: gastroparesis (Pt states that it has gotten better, pt states it still take her a week to have a bowel movement) and Nausea (Pt states she is still having nausea and vomiting stomach acid, pt states this has become more regular)   Subjective  HPI: Summary of GI issues: Initial office consultation March 2024 for months of esophageal dysphagia and weight loss without apparent pre-existing reflux symptoms, the primary care had started her on a PPI.  She had some sinus congestion and throat mucus that sounded allergic as well.  Patient had had significant purposeful weight loss after starting GLP-1 agonist in 2022 or 2023. EGD 07/02/2022 by Dr. Myrtie Neither revealed a normal-appearing esophagus with active motility and no resistance passing scope through EGJ.  There was a large amount of retained food in the stomach.  (Patient had been off GLP-1 agonist a week prior to this procedure) Subsequent CTAP obtained to rule out bowel obstruction or neoplasia, results normal (report on file and an 02/08/2023 office note) For consideration of a metoclopramide trial, PCP records were obtained and showed normal EKG on 10/02/2021-normal sinus rhythm, rate 72, QTc 451 ________________________________________ After the CT results, patient was advised to talk to primary care about stopping the GLP-1 agonist, she was prescribed a trial of metoclopramide 5 mg 3 times daily.  Today, for some additional history, she repots being diagnosed with diabetes 14 years ago. Her last A1c was 7.8.  And recalls that the previous hemoglobin A1c was 5.2 when on Mounjaro. Currently, her PCP replaced the GLP-1 agonist with additional Lantus dosing.  She states that her nausea and vomiting episodes have somewhat  improved but she continues to have episodes every now and they  tend to typically occur in the morning. She denies bilious vomiting at this time. She states that she is currently not taking Metoclopramide. She was on it for about 8 weeks only. She states that she did not have daily vomiting episodes while on it, this of the symptoms seem to have improved while taking it  She continues to complain of chronic constipation occurring for years.  She states that she uses Miralax and other laxatives but states they are not effective in managing her symptoms. On average, she has a BM once every 4-5 day. She denies any blood in stool. We further discussed how any GLP-1 medications are probably contributing to her symptoms.    Patient denies any diarrhea, blood in stool, black stool, abdominal pain, bloating, unintentional weight loss, reflux, or dysphagia.    ROS:  Review of Systems  Constitutional:  Negative for appetite change and fever.  HENT:  Negative for trouble swallowing.   Respiratory:  Negative for cough and shortness of breath.   Cardiovascular:  Negative for chest pain.  Gastrointestinal:  Positive for constipation, nausea and vomiting. Negative for abdominal distention, abdominal pain, anal bleeding, blood in stool, diarrhea and rectal pain.  Genitourinary:  Negative for dysuria.  Musculoskeletal:  Negative for back pain.  Skin:  Negative for rash.  Neurological:  Negative for weakness.  All other systems reviewed and are negative.    Past Medical History: Past Medical History:  Diagnosis Date   ABNORMAL PAP SMEAR 03/08/2008   ARTHRITIS 03/09/2008   ASTHMA 03/09/2008   COLPOSCOPY, HX OF 03/08/2008   Depression    Diabetes (HCC)    DM 12/08/2008   Gallstones  Hypercholesteremia    Irregular menstrual cycle 03/08/2008   Obesity      Past Surgical History: Past Surgical History:  Procedure Laterality Date   BACK SURGERY     CHOLECYSTECTOMY     INNER EAR SURGERY     TONSILLECTOMY       Family History: Family History   Problem Relation Age of Onset   Diabetes Mother        "diet-controlled"   Heart disease Father    Colon cancer Neg Hx    Stomach cancer Neg Hx    Esophageal cancer Neg Hx     Social History: Social History   Socioeconomic History   Marital status: Married    Spouse name: Not on file   Number of children: 1   Years of education: Not on file   Highest education level: Not on file  Occupational History   Occupation: Occupational hygienist  Tobacco Use   Smoking status: Former    Current packs/day: 0.00    Types: Cigarettes    Quit date: 07/04/2000    Years since quitting: 22.6   Smokeless tobacco: Never  Vaping Use   Vaping status: Never Used  Substance and Sexual Activity   Alcohol use: No   Drug use: No   Sexual activity: Not on file  Other Topics Concern   Not on file  Social History Narrative   Not on file   Social Determinants of Health   Financial Resource Strain: Not on file  Food Insecurity: No Food Insecurity (10/03/2021)   Received from [place] [employer] Insecurity    Do you need food for this week?: No    Are you able to get enough food for your family? (Household - for ages 0-17 years): Not on file    Does your family need food this week? (Household - for ages 0-17 years): Not on file    Do you always have enough food for your family? (Household - for ages 0-17 years): Not on file  Transportation Needs: No Transportation Needs (10/03/2021)   Received from [place], [place]   Transportation Needs    READ ONLY Do you have trouble getting a ride to medical visits or work?: Never True    Does your family have a hard time getting a ride to doctors' visits? (Household - for ages 0-17 years): Not on file    Has lack of transportation kept you from medical appointments, meetings, work, or from getting things needed for daily living? Check all that apply. (Adult - for ages 17 years and over): Not on file    Do you (or your family) have trouble finding or  paying for a ride (transportation)? (Household - for ages 0-17 years): Not on file  Physical Activity: Not on file  Stress: Not on file  Social Connections: Unknown (09/25/2022)   Received from [place], [place]   Social Connections    How often do you feel lonely or isolated from those around you? (Adult - for ages 51 years and over): Not on file    Allergies: Allergies  Allergen Reactions   Amoxicillin    Azithromycin    Barbiturates    Erythromycin Other (See Comments)   Penicillins    Pentobarbital    Sulfamethoxazole-Trimethoprim    Sulfonamide Derivatives Other (See Comments)    Outpatient Meds: Current Outpatient Medications  Medication Sig Dispense Refill   acetaminophen (TYLENOL) 325 MG tablet Take by mouth.     amLODipine (NORVASC) 5 MG  tablet Take by mouth.     buPROPion (WELLBUTRIN XL) 300 MG 24 hr tablet TAKE 1 TABLET BY MOUTH ONCE DAILY FOR 90 DAYS     CAMRESE 0.15-0.03 &0.01 MG tablet      cetirizine (ZYRTEC) 10 MG tablet Take 1 tablet by mouth at bedtime.     DULoxetine (CYMBALTA) 60 MG capsule Take 2 capsules by mouth at bedtime.     famotidine (PEPCID) 20 MG tablet Take 1 tablet by mouth every morning.     glimepiride (AMARYL) 4 MG tablet Take by mouth.     hydrochlorothiazide (HYDRODIURIL) 25 MG tablet Take 25 mg by mouth daily.     insulin glargine (LANTUS) 100 UNIT/ML injection Inject 40 Units into the skin daily.     levothyroxine (SYNTHROID) 25 MCG tablet Take 1 tablet by mouth daily.     losartan (COZAAR) 100 MG tablet Take 1 tablet by mouth at bedtime.     metFORMIN (GLUCOPHAGE) 1000 MG tablet Take by mouth.     montelukast (SINGULAIR) 10 MG tablet Take 1 tablet by mouth daily.     OMEPRAZOLE PO Take by mouth.     saxagliptin HCl (ONGLYZA) 5 MG TABS tablet Take 5 mg by mouth daily.     senna (SENOKOT) 8.6 MG tablet Take 1 tablet by mouth 2 (two) times daily.     sitaGLIPtin (JANUVIA) 100 MG tablet Take by mouth.     traZODone (DESYREL) 50 MG  tablet Take 1 tablet by mouth at bedtime.     atorvastatin (LIPITOR) 10 MG tablet TAKE ONE TABLET BY MOUTH ONCE DAILY (Patient not taking: Reported on 07/02/2022) 90 tablet 0   atorvastatin (LIPITOR) 40 MG tablet Take 1 tablet by mouth daily. (Patient not taking: Reported on 02/08/2023)     budesonide-formoterol (SYMBICORT) 160-4.5 MCG/ACT inhaler 2 puffs, inhale, BID, # 6 g, 5 Refill(s), Pharmacy: [facility] [phone], 170, cm, 02/16/21 8:33:00 EST, Height/Length Measured (Patient not taking: Reported on 06/21/2022)     celecoxib (CELEBREX) 200 MG capsule Take by mouth. (Patient not taking: Reported on 06/21/2022)     cephALEXin (KEFLEX) 500 MG capsule Take 1 capsule 3 times a day by oral route as directed for 10 days.     gabapentin (NEURONTIN) 300 MG capsule Take 1 capsule (300 mg total) by mouth 3 (three) times daily. (Patient not taking: Reported on 07/02/2022) 30 capsule 11   glucose blood (RELION GLUCOSE TEST STRIPS) test strip Two times a day dx 250.00      insulin NPH-regular Human (NOVOLIN 70/30) (70-30) 100 UNIT/ML injection 30 units with breakfast, and 15 units with supper, and syringes 2/day (Patient not taking: Reported on 06/21/2022) 20 mL 11   JARDIANCE 10 MG TABS tablet Take 10 mg by mouth daily.     metoCLOPramide (REGLAN) 5 MG tablet Take 1-2 tablets (5 mg -10 mg total) 3 times daily before meals and at bedtime PRN (Patient not taking: Reported on 02/08/2023) 240 tablet 0   tirzepatide (MOUNJARO) 7.5 MG/0.5ML Pen Inject into the skin.     tiZANidine (ZANAFLEX) 4 MG tablet TAKE 1 TABLET BY MOUTH EVERY 8 HOURS AS NEEDED FOR 30 DAYS (Patient not taking: Reported on 02/08/2023)     No current facility-administered medications for this visit.      ___________________________________________________________________ Objective   Exam:  BP 98/70   Pulse 88   Ht 5\' 5"  (1.651 m)   Wt 252 lb (114.3 kg)   BMI 41.93 kg/m  Wt Readings from  Last 3 Encounters:  02/08/23 252 lb  (114.3 kg)  07/02/22 205 lb (93 kg)  06/14/16 242 lb (109.8 kg)   General: well-appearing   Eyes: sclera anicteric, no redness ENT: oral mucosa moist without lesions, no cervical or supraclavicular lymphadenopathy CV: RRR, no JVD, no peripheral edema Resp: clear to auscultation bilaterally, normal RR and effort noted GI: soft, no tenderness, with active bowel sounds. No guarding or palpable organomegaly noted. Skin; warm and dry, no rash or jaundice noted Neuro: awake, alert and oriented x 3. Normal gross motor function and fluent speech   Labs:   Radiologic Studies:  Narrative & Impression  CLINICAL DATA:  Unintentional weight loss. Abdominal pain and bloating. Nausea and vomiting. Retained food seen in stomach on recent endoscopy.   EXAM: CT ABDOMEN AND PELVIS WITH CONTRAST   TECHNIQUE: Multidetector CT imaging of the abdomen and pelvis was performed using the standard protocol following bolus administration of intravenous contrast.   RADIATION DOSE REDUCTION: This exam was performed according to the departmental dose-optimization program which includes automated exposure control, adjustment of the mA and/or kV according to patient size and/or use of iterative reconstruction technique.   CONTRAST:  ISOVUE-300 IOPAMIDOL (ISOVUE-300) INJECTION 61%   COMPARISON:  None Available.   FINDINGS: Lower Chest: No acute findings.   Hepatobiliary: No suspicious hepatic masses identified. Prior cholecystectomy. No evidence of biliary obstruction.   Pancreas:  No mass or inflammatory changes.   Spleen: Within normal limits in size and appearance.   Adrenals/Urinary Tract: No suspicious masses identified. No evidence of ureteral calculi or hydronephrosis.   Stomach/Bowel: No evidence of gastric distention. No evidence of bowel obstruction, inflammatory process or abnormal fluid collections. Normal appendix visualized.   Vascular/Lymphatic: No pathologically enlarged  lymph nodes. No acute vascular findings.   Reproductive:  No mass or other significant abnormality.   Other:  None.   Musculoskeletal: No suspicious bone lesions identified. Lumbar spine fusion hardware noted.   IMPRESSION: Negative. No radiographic evidence of malignancy or other acute findings.     Electronically Signed   By: Danae Orleans M.D.   On: 11/08/2022 18:56    Assessment: Nausea and vomiting, unspecified vomiting type  Chronic constipation  Gastroparesis  Early satiety   While her GLP-1 agonist was probably contributing somewhat to the delayed gastric emptying, I believe the primary problem is underlying diabetic related gastroparesis.  And while I do not have primary care records since they are outside this health system and have not sent them to me, Natalia is a good historian and knows her diabetes well as well as her lab work.  It is clear that her diabetes has been more difficult to control off that class of medicines, so she needs to go back on it.  Then we will need to work through the gastroparesis with what medicines we have.  Her chronic constipation needs attention as well.  I think a good medicine choice for her is Motegrity 2 mg once daily.  It is primarily used for chronic idiopathic constipation, but it is known to have improvement in gastric emptying for some patients due to its mechanism of action.  Therefore, if we are able to obtain it and it does not have undesirable side effects, it might be useful for both of those problems, thereby perhaps allowing [place] to keep her off the metoclopramide. If her vomiting and early satiety and dysphagia recurs I did before on GLP-1 agonist, I think we will need to resume the  metoclopramide at least temporarily in hopes that we can get things under better with Motegrity and then perhaps wean back off the metoclopramide.  I did my best to explain the complicated nature of this condition and what we are trying to do with  medicines, and she understood and was appreciative. Plan: I do not yet know how we might navigate any insurance access or cost issue with the Motegrity, since other alternatives for constipation such as Amitiza, Linzess Trulance or Allena Napoleon might help relieve constipation, but would not be expected to improve gastroparesis.  She lives in [place] and is a long trip for an office visit, next available office visit is at least 2 months from now, so she will keep in touch with me by portal message in several weeks with an update on the Motegrity availability and effect.  Then I can make further recommendations accordingly.  I will copy my note to her primary care provider as well to help maintain optimal continuity of care coordination.   45 minutes were spent on this encounter (including chart review, history/exam, counseling/coordination of care, and documentation) > 50% of that time was spent on counseling and coordination of care.    Thank you for the courtesy of this consult.  Please call me with any questions or concerns.  I,Safa M Kadhim,acting as a scribe for Charlie Pitter III, MD.,have documented all relevant documentation on the behalf of Sherrilyn Rist, MD,as directed by  Sherrilyn Rist, MD while in the presence of Sherrilyn Rist, MD.   Marvis Repress III, MD, have reviewed all documentation for this visit. The documentation on 02/08/23 for the exam, diagnosis, procedures, and orders are all accurate and complete.    CC: Referring provider noted above

## 2023-02-08 NOTE — Progress Notes (Deleted)
[facility] Consult/Progess Note:  History: Judith Ray 02/08/2023  Referring provider: Daryel Gerald, FNP  Reason for consult/chief complaint: No chief complaint on file.   Subjective  HPI: Summary of GI issues: Initial office consultation March 2024 for months of esophageal dysphagia and weight loss without apparent pre-existing reflux symptoms, the primary care had started her on a PPI.  She had some sinus congestion and throat mucus that sounded allergic as well.  Patient had had significant purposeful weight loss after starting GLP-1 agonist in 2022 or 2023. EGD 07/02/2022 by Dr. Myrtie Neither revealed a normal-appearing esophagus with active motility and no resistance passing scope through EGJ.  There was a large amount of retained food in the stomach.  (Patient had been off GLP-1 agonist a week prior to this procedure) Subsequent CTAP obtained to rule out bowel obstruction or neoplasia, results normal (report on file and an 02/08/2023 office note) For consideration of a metoclopramide trial, PCP records were obtained and showed normal EKG on 10/02/2021-normal sinus rhythm, rate 72, QTc 451 ________________________________________ After the CT results, patient was advised to talk to primary care about stopping the GLP-1 agonist, she was prescribed a trial of metoclopramide 5 mg 3 times daily.  ***   ROS:  Review of Systems   Past Medical History: Past Medical History:  Diagnosis Date   ABNORMAL PAP SMEAR 03/08/2008   ARTHRITIS 03/09/2008   ASTHMA 03/09/2008   COLPOSCOPY, HX OF 03/08/2008   Depression    Diabetes (HCC)    DM 12/08/2008   Gallstones    Hypercholesteremia    Irregular menstrual cycle 03/08/2008   Obesity      Past Surgical History: Past Surgical History:  Procedure Laterality Date   BACK SURGERY     CHOLECYSTECTOMY     INNER EAR SURGERY     TONSILLECTOMY       Family History: Family History  Problem Relation Age of Onset    Diabetes Mother        "diet-controlled"   Heart disease Father    Colon cancer Neg Hx    Stomach cancer Neg Hx    Esophageal cancer Neg Hx     Social History: Social History   Socioeconomic History   Marital status: Married    Spouse name: Not on file   Number of children: 1   Years of education: Not on file   Highest education level: Not on file  Occupational History   Occupation: Occupational hygienist  Tobacco Use   Smoking status: Former    Current packs/day: 0.00    Types: Cigarettes    Quit date: 07/04/2000    Years since quitting: 22.6   Smokeless tobacco: Never  Vaping Use   Vaping status: Never Used  Substance and Sexual Activity   Alcohol use: No   Drug use: No   Sexual activity: Not on file  Other Topics Concern   Not on file  Social History Narrative   Not on file   Social Determinants of Health   Financial Resource Strain: Not on file  Food Insecurity: No Food Insecurity (10/03/2021)   Received from [place], [employer] Insecurity    Do you need food for this week?: No    Are you able to get enough food for your family? (Household - for ages 0-17 years): Not on file    Does your family need food this week? (Household - for ages 0-17 years): Not on file    Do you always  have enough food for your family? (Household - for ages 0-17 years): Not on file  Transportation Needs: No Transportation Needs (10/03/2021)   Received from [place], [place]   Transportation Needs    READ ONLY Do you have trouble getting a ride to medical visits or work?: Never True    Does your family have a hard time getting a ride to doctors' visits? (Household - for ages 0-17 years): Not on file    Has lack of transportation kept you from medical appointments, meetings, work, or from getting things needed for daily living? Check all that apply. (Adult - for ages 84 years and over): Not on file    Do you (or your family) have trouble finding or paying for a ride (transportation)?  (Household - for ages 0-17 years): Not on file  Physical Activity: Not on file  Stress: Not on file  Social Connections: Unknown (09/25/2022)   Received from [place], [place]   Social Connections    How often do you feel lonely or isolated from those around you? (Adult - for ages 74 years and over): Not on file    Allergies: Allergies  Allergen Reactions   Amoxicillin    Azithromycin    Barbiturates    Erythromycin Other (See Comments)   Penicillins    Pentobarbital    Sulfamethoxazole-Trimethoprim    Sulfonamide Derivatives Other (See Comments)    Outpatient Meds: Current Outpatient Medications  Medication Sig Dispense Refill   senna (SENOKOT) 8.6 MG tablet Take 1 tablet by mouth 2 (two) times daily.     acetaminophen (TYLENOL) 325 MG tablet Take by mouth.     amLODipine (NORVASC) 5 MG tablet Take by mouth.     atorvastatin (LIPITOR) 10 MG tablet TAKE ONE TABLET BY MOUTH ONCE DAILY (Patient not taking: Reported on 07/02/2022) 90 tablet 0   atorvastatin (LIPITOR) 40 MG tablet Take 1 tablet by mouth daily.     budesonide-formoterol (SYMBICORT) 160-4.5 MCG/ACT inhaler 2 puffs, inhale, BID, # 6 g, 5 Refill(s), Pharmacy: [facility] [phone], 170, cm, 02/16/21 8:33:00 EST, Height/Length Measured (Patient not taking: Reported on 06/21/2022)     buPROPion (WELLBUTRIN XL) 300 MG 24 hr tablet TAKE 1 TABLET BY MOUTH ONCE DAILY FOR 90 DAYS     CAMRESE 0.15-0.03 &0.01 MG tablet      celecoxib (CELEBREX) 200 MG capsule Take by mouth. (Patient not taking: Reported on 06/21/2022)     cephALEXin (KEFLEX) 500 MG capsule Take 1 capsule 3 times a day by oral route as directed for 10 days.     cetirizine (ZYRTEC) 10 MG tablet Take 1 tablet by mouth at bedtime.     DULoxetine (CYMBALTA) 60 MG capsule Take 2 capsules by mouth at bedtime.     famotidine (PEPCID) 20 MG tablet Take 1 tablet by mouth every morning.     gabapentin (NEURONTIN) 300 MG capsule Take 1 capsule (300 mg total) by  mouth 3 (three) times daily. (Patient not taking: Reported on 07/02/2022) 30 capsule 11   glimepiride (AMARYL) 4 MG tablet Take by mouth.     glucose blood (RELION GLUCOSE TEST STRIPS) test strip Two times a day dx 250.00      hydrochlorothiazide (HYDRODIURIL) 25 MG tablet Take 25 mg by mouth daily.     insulin NPH-regular Human (NOVOLIN 70/30) (70-30) 100 UNIT/ML injection 30 units with breakfast, and 15 units with supper, and syringes 2/day (Patient not taking: Reported on 06/21/2022) 20 mL 11   JARDIANCE 10 MG  TABS tablet Take 10 mg by mouth daily.     levothyroxine (SYNTHROID) 25 MCG tablet Take 1 tablet by mouth daily.     losartan (COZAAR) 100 MG tablet Take 1 tablet by mouth at bedtime.     metFORMIN (GLUCOPHAGE) 1000 MG tablet Take by mouth.     metoCLOPramide (REGLAN) 5 MG tablet Take 1-2 tablets (5 mg -10 mg total) 3 times daily before meals and at bedtime PRN 240 tablet 0   montelukast (SINGULAIR) 10 MG tablet Take 1 tablet by mouth daily.     OMEPRAZOLE PO Take by mouth.     saxagliptin HCl (ONGLYZA) 5 MG TABS tablet Take 5 mg by mouth daily.     sitaGLIPtin (JANUVIA) 100 MG tablet Take by mouth.     tirzepatide (MOUNJARO) 7.5 MG/0.5ML Pen Inject into the skin.     tiZANidine (ZANAFLEX) 4 MG tablet TAKE 1 TABLET BY MOUTH EVERY 8 HOURS AS NEEDED FOR 30 DAYS     traZODone (DESYREL) 50 MG tablet Take 1 tablet by mouth at bedtime.     No current facility-administered medications for this visit.      ___________________________________________________________________ Objective   Exam:  There were no vitals taken for this visit. Wt Readings from Last 3 Encounters:  07/02/22 205 lb (93 kg)  06/14/16 242 lb (109.8 kg)  05/12/14 246 lb (111.6 kg)    General: ***  Eyes: sclera anicteric, no redness ENT: oral mucosa moist without lesions, no cervical or supraclavicular lymphadenopathy CV: ***, no JVD, no peripheral edema Resp: clear to auscultation bilaterally, normal RR and  effort noted GI: soft, *** tenderness, with active bowel sounds. No guarding or palpable organomegaly noted. Skin; warm and dry, no rash or jaundice noted Neuro: awake, alert and oriented x 3. Normal gross motor function and fluent speech  Labs:  ***  Radiologic Studies:  Narrative & Impression  CLINICAL DATA:  Unintentional weight loss. Abdominal pain and bloating. Nausea and vomiting. Retained food seen in stomach on recent endoscopy.   EXAM: CT ABDOMEN AND PELVIS WITH CONTRAST   TECHNIQUE: Multidetector CT imaging of the abdomen and pelvis was performed using the standard protocol following bolus administration of intravenous contrast.   RADIATION DOSE REDUCTION: This exam was performed according to the departmental dose-optimization program which includes automated exposure control, adjustment of the mA and/or kV according to patient size and/or use of iterative reconstruction technique.   CONTRAST:  ISOVUE-300 IOPAMIDOL (ISOVUE-300) INJECTION 61%   COMPARISON:  None Available.   FINDINGS: Lower Chest: No acute findings.   Hepatobiliary: No suspicious hepatic masses identified. Prior cholecystectomy. No evidence of biliary obstruction.   Pancreas:  No mass or inflammatory changes.   Spleen: Within normal limits in size and appearance.   Adrenals/Urinary Tract: No suspicious masses identified. No evidence of ureteral calculi or hydronephrosis.   Stomach/Bowel: No evidence of gastric distention. No evidence of bowel obstruction, inflammatory process or abnormal fluid collections. Normal appendix visualized.   Vascular/Lymphatic: No pathologically enlarged lymph nodes. No acute vascular findings.   Reproductive:  No mass or other significant abnormality.   Other:  None.   Musculoskeletal: No suspicious bone lesions identified. Lumbar spine fusion hardware noted.   IMPRESSION: Negative. No radiographic evidence of malignancy or other  acute findings.     Electronically Signed   By: Danae Orleans M.D.   On: 11/08/2022 18:56    Assessment: No diagnosis found.  ***  Plan:  ***  Thank you for the courtesy  of this consult.  Please call me with any questions or concerns.  Charlie Pitter III  CC: Referring provider noted above

## 2023-02-08 NOTE — Patient Instructions (Addendum)
  We have sent the following medications to your pharmacy for you to pick up at your convenience: Motegrity  _______________________________________________________  If your blood pressure at your visit was 140/90 or greater, please contact your primary care physician to follow up on this.  _______________________________________________________  If you are age 44 or older, your body mass index should be between 23-30. Your Body mass index is 41.93 kg/m. If this is out of the aforementioned range listed, please consider follow up with your Primary Care Provider.  If you are age 10 or younger, your body mass index should be between 19-25. Your Body mass index is 41.93 kg/m. If this is out of the aformentioned range listed, please consider follow up with your Primary Care Provider.   ________________________________________________________  The [facility] providers would like to encourage you to use [facility] to communicate with providers for non-urgent requests or questions.  Due to long hold times on the telephone, sending your provider a message by [facility] may be a faster and more efficient way to get a response.  Please allow 48 business hours for a response.  Please remember that this is for non-urgent requests.  _______________________________________________________ It was a pleasure to see you today!  Thank you for trusting me with your gastrointestinal care!

## 2023-02-12 ENCOUNTER — Encounter: Payer: Self-pay | Admitting: Gastroenterology

## 2023-02-12 NOTE — Telephone Encounter (Signed)
Please see note from pt and assist with the PA

## 2023-02-18 ENCOUNTER — Other Ambulatory Visit (HOSPITAL_COMMUNITY): Payer: Self-pay

## 2023-02-18 ENCOUNTER — Telehealth: Payer: Self-pay

## 2023-02-18 NOTE — Telephone Encounter (Signed)
Please look into this medication PA question from pharmacy.  - HD

## 2023-02-18 NOTE — Telephone Encounter (Signed)
PA request has been Submitted. New Encounter created for follow up. For additional info see Pharmacy Prior Auth telephone encounter from 11/11.

## 2023-02-18 NOTE — Telephone Encounter (Signed)
We have a prior authorization request pending review for the requested drug for this member. For questions regarding this member request, please contact the phone number on the back of the prescription ID card. 

## 2023-02-18 NOTE — Telephone Encounter (Signed)
*  Gastro  Pharmacy Patient Advocate Encounter   Received notification from Patient Advice Request messages that prior authorization for Motegrity 2mg  is required/requested.   Insurance verification completed.   The patient is insured through [facility] .   Per test claim: PA required; PA started via CoverMyMeds. KEY [id number] . Waiting for clinical questions to populate.

## 2023-02-18 NOTE — Telephone Encounter (Signed)
Patient stated previously that plan was to fax over a PA form- I do not see this uploaded to the patients profile at this time

## 2023-02-19 NOTE — Telephone Encounter (Signed)
Pharmacy Patient Advocate Encounter  Received notification from [facility] that Prior Authorization for MOTEGRITY 2MG  has been DENIED.  Full denial letter will be uploaded to the media tab. See denial reason below.   PA #/Case ID/Reference #:

## 2023-02-22 ENCOUNTER — Encounter: Payer: Self-pay | Admitting: Gastroenterology

## 2023-02-26 ENCOUNTER — Other Ambulatory Visit: Payer: Self-pay

## 2023-02-26 MED ORDER — LINACLOTIDE 145 MCG PO CAPS: 145.0000 ug | ORAL_CAPSULE | Freq: Every day | ORAL | 0 refills | Status: DC

## 2023-02-26 NOTE — Telephone Encounter (Signed)
We will try Linzess 145 mcg once daily Prescribe a 2-week supply  Then we would like to hear from her at the end of that 2 weeks to know how it went.  If it did not improve constipation well, or if it was not tolerated well due to diarrhea, then the neck step will be...  Amitiza 8 mcg, 1 tablet twice daily for 2 weeks Call us at the end of that 2 weeks.  Then, if neither medicine works nor is well-tolerated, we should be able to get prior authorization for [employer].  H Danis

## 2023-02-26 NOTE — Telephone Encounter (Signed)
Pt is aware of mychart message. Last read by Marrian Salvage at  9:07 AM on 02/26/2023.

## 2023-02-26 NOTE — Telephone Encounter (Signed)
Unable to leave message, VM box full. Prescription sent to pharmacy. Mychart message sent.

## 2023-02-28 ENCOUNTER — Other Ambulatory Visit: Payer: Self-pay | Admitting: Gastroenterology

## 2023-03-26 ENCOUNTER — Other Ambulatory Visit: Payer: Self-pay | Admitting: Gastroenterology

## 2023-07-18 ENCOUNTER — Other Ambulatory Visit: Payer: Self-pay | Admitting: Gastroenterology

## 2023-08-18 ENCOUNTER — Other Ambulatory Visit: Payer: Self-pay | Admitting: Gastroenterology

## 2023-09-21 ENCOUNTER — Other Ambulatory Visit: Payer: Self-pay | Admitting: Gastroenterology

## 2024-01-17 ENCOUNTER — Other Ambulatory Visit: Payer: Self-pay | Admitting: Gastroenterology

## 2024-02-05 ENCOUNTER — Encounter: Payer: Self-pay | Admitting: Gastroenterology

## 2024-02-14 ENCOUNTER — Other Ambulatory Visit: Payer: Self-pay | Admitting: Gastroenterology

## 2024-02-18 ENCOUNTER — Telehealth: Payer: Self-pay

## 2024-02-18 NOTE — Telephone Encounter (Signed)
 John, Please advise if the medication STEGLATRO will need to be held and for how long prior to the procedure scheduled on 03/11/2024 at [facility] PV rm 52 on 02/27/2024  Please/thank you Bre, PV RN

## 2024-02-20 NOTE — Telephone Encounter (Signed)
 Will note on PV chart

## 2024-02-27 ENCOUNTER — Ambulatory Visit

## 2024-02-27 ENCOUNTER — Other Ambulatory Visit (AMBULATORY_SURGERY_CENTER): Payer: Self-pay

## 2024-02-27 VITALS — Ht 65.0 in | Wt 238.4 lb

## 2024-02-27 DIAGNOSIS — Z1211 Encounter for screening for malignant neoplasm of colon: Secondary | ICD-10-CM

## 2024-02-27 MED ORDER — NA SULFATE-K SULFATE-MG SULF 17.5-3.13-1.6 GM/177ML PO SOLN: 1.0000 | Freq: Once | ORAL | 0 refills | Status: AC

## 2024-02-27 NOTE — Progress Notes (Signed)
 No egg or soy allergy known to patient  No issues known to pt with past sedation with any surgeries or procedures Patient denies ever being told they had issues or difficulty with intubation  No FH of Malignant Hyperthermia Pt is not on diet pills Pt is not on  home 02  Pt is not on blood thinners  Pt denies issues with constipation- YES  No A fib or A flutter Have any cardiac testing pending- NO Pt denies use of chewing tobacco Discussed diabetic I weight loss medication holds Discussed NSAID holds Checked BMI Pt instructed to use Singlecare.com or GoodRx for a price reduction on prep  Patient's chart reviewed by Norleen Schillings CNRA prior to previsit and patient appropriate for the [facility].  Pre visit completed and red dot placed by patient's name on their procedure day (on provider's schedule).

## 2024-02-27 NOTE — Progress Notes (Signed)
Prep sent to preferred pharmacy

## 2024-03-02 ENCOUNTER — Encounter: Payer: Self-pay | Admitting: Gastroenterology

## 2024-03-11 ENCOUNTER — Ambulatory Visit: Admitting: Gastroenterology

## 2024-03-11 ENCOUNTER — Encounter: Payer: Self-pay | Admitting: Gastroenterology

## 2024-03-11 VITALS — BP 114/69 | HR 81 | Temp 97.0°F | Resp 13 | Ht 65.0 in | Wt 238.0 lb

## 2024-03-11 DIAGNOSIS — Z1211 Encounter for screening for malignant neoplasm of colon: Secondary | ICD-10-CM

## 2024-03-11 DIAGNOSIS — K5730 Diverticulosis of large intestine without perforation or abscess without bleeding: Secondary | ICD-10-CM

## 2024-03-11 DIAGNOSIS — K648 Other hemorrhoids: Secondary | ICD-10-CM | POA: Diagnosis not present

## 2024-03-11 DIAGNOSIS — D122 Benign neoplasm of ascending colon: Secondary | ICD-10-CM

## 2024-03-11 MED ORDER — SODIUM CHLORIDE 0.9 % IV SOLN: 500.0000 mL | Freq: Once | INTRAVENOUS | Status: DC

## 2024-03-11 MED ORDER — LINACLOTIDE 145 MCG PO CAPS: 145.0000 ug | ORAL_CAPSULE | Freq: Every day | ORAL | 6 refills | Status: AC

## 2024-03-11 NOTE — Op Note (Signed)
 [facility] Patient Name: Judith Ray Procedure Date: 03/11/2024 11:11 AM MRN: [id number] Endoscopist: Victory L. Legrand , MD, [id number] Age: 43 Referring MD:  Date of Birth: [birth year] Gender: Female Account #: [id number] Procedure:                Colonoscopy Indications:              Screening for colorectal malignant neoplasm, This                            is the patient's first colonoscopy                           Incidental constipation noted (on Linzess ) Medicines:                Monitored Anesthesia Care Procedure:                Pre-Anesthesia Assessment:                           - Prior to the procedure, a History and Physical                            was performed, and patient medications and                            allergies were reviewed. The patient's tolerance of                            previous anesthesia was also reviewed. The risks                            and benefits of the procedure and the sedation                            options and risks were discussed with the patient.                            All questions were answered, and informed consent                            was obtained. Prior Anticoagulants: The patient has                            taken no anticoagulant or antiplatelet agents. ASA                            Grade Assessment: II - A patient with mild systemic                            disease. After reviewing the risks and benefits,                            the patient was deemed in satisfactory condition to  undergo the procedure.                           After obtaining informed consent, the colonoscope                            was passed under direct vision. Throughout the                            procedure, the patient's blood pressure, pulse, and                            oxygen saturations were monitored continuously. The                            CF [id number] #[id number] was introduced  through the anus                            and advanced to the the cecum, identified by                            appendiceal orifice and ileocecal valve. The                            colonoscopy was somewhat difficult due to a                            redundant colon. Successful completion of the                            procedure was aided by using manual pressure and                            straightening and shortening the scope to obtain                            bowel loop reduction. The patient tolerated the                            procedure well. The quality of the bowel                            preparation was good. The ileocecal valve,                            appendiceal orifice, and rectum were photographed.                            The bowel preparation used was SUPREP (with daily                            miralax for a week prior). Scope In: 11:21:04 AM Scope Out: 11:37:08 AM Scope Withdrawal Time: 0 hours 10 minutes 49 seconds  Total Procedure Duration: 0 hours  16 minutes 4 seconds  Findings:                 The perianal and digital rectal examinations were                            normal.                           A diminutive polyp was found in the ascending                            colon. The polyp was semi-sessile. The polyp was                            removed with a cold snare. Resection and retrieval                            were complete.                           Repeat examination of right colon under NBI                            performed.                           A few small-mouthed diverticula were found from                            transverse colon to descending colon.                           Internal hemorrhoids were found. The hemorrhoids                            were small.                           The exam was otherwise without abnormality on                            direct and retroflexion views. Complications:             No immediate complications. Estimated Blood Loss:     Estimated blood loss was minimal. Impression:               - One diminutive polyp in the ascending colon,                            removed with a cold snare. Resected and retrieved.                           - Diverticulosis from transverse colon to                            descending colon.                           -  Internal hemorrhoids.                           - The examination was otherwise normal on direct                            and retroflexion views. Recommendation:           - Patient has a contact number available for                            emergencies. The signs and symptoms of potential                            delayed complications were discussed with the                            patient. Return to normal activities tomorrow.                            Written discharge instructions were provided to the                            patient.                           - Resume previous diet.                           - Continue present medications.                           - Await pathology results.                           - Repeat colonoscopy is recommended for                            surveillance. The colonoscopy date will be                            determined after pathology results from today's                            exam become available for review. Blakelynn Scheeler L. Legrand, MD 03/11/2024 11:42:29 AM This report has been signed electronically.

## 2024-03-11 NOTE — Progress Notes (Signed)
 Report given to PACU, vss

## 2024-03-11 NOTE — Progress Notes (Signed)
 Pt's states no medical or surgical changes since previsit or office visit.

## 2024-03-11 NOTE — Patient Instructions (Addendum)
-  Handout on polyps, hemorrhoids and diverticulosis provided -Await pathology results -Levsin refill sent to pharmacy  YOU HAD AN ENDOSCOPIC PROCEDURE TODAY AT THE [facility]:   Refer to the procedure report that was given to you for any specific questions about what was found during the examination.  If the procedure report does not answer your questions, please call your gastroenterologist to clarify.  If you requested that your care partner not be given the details of your procedure findings, then the procedure report has been included in a sealed envelope for you to review at your convenience later.  YOU SHOULD EXPECT: Some feelings of bloating in the abdomen. Passage of more gas than usual.  Walking can help get rid of the air that was put into your GI tract during the procedure and reduce the bloating. If you had a lower endoscopy (such as a colonoscopy or flexible sigmoidoscopy) you may notice spotting of blood in your stool or on the toilet paper. If you underwent a bowel prep for your procedure, you may not have a normal bowel movement for a few days.  Please Note:  You might notice some irritation and congestion in your nose or some drainage.  This is from the oxygen used during your procedure.  There is no need for concern and it should clear up in a day or so.  SYMPTOMS TO REPORT IMMEDIATELY:  Following lower endoscopy (colonoscopy or flexible sigmoidoscopy):  Excessive amounts of blood in the stool  Significant tenderness or worsening of abdominal pains  Swelling of the abdomen that is new, acute  Fever of 100F or higher  For urgent or emergent issues, a gastroenterologist can be reached at any hour by calling [phone]. Do not use MyChart messaging for urgent concerns.    DIET:  We do recommend a small meal at first, but then you may proceed to your regular diet.  Drink plenty of fluids but you should avoid alcoholic beverages for 24 hours.  ACTIVITY:  You  should plan to take it easy for the rest of today and you should NOT DRIVE or use heavy machinery until tomorrow (because of the sedation medicines used during the test).    FOLLOW UP: Our staff will call the number listed on your records the next business day following your procedure.  We will call around 7:15- 8:00 am to check on you and address any questions or concerns that you may have regarding the information given to you following your procedure. If we do not reach you, we will leave a message.     If any biopsies were taken you will be contacted by phone or by letter within the next 1-3 weeks.  Please call us  at [phone] if you have not heard about the biopsies in 3 weeks.    SIGNATURES/CONFIDENTIALITY: You and/or your care partner have signed paperwork which will be entered into your electronic medical record.  These signatures attest to the fact that that the information above on your After Visit Summary has been reviewed and is understood.  Full responsibility of the confidentiality of this discharge information lies with you and/or your care-partner.

## 2024-03-11 NOTE — Progress Notes (Signed)
 Called to room to assist during endoscopic procedure.  Patient ID and intended procedure confirmed with present staff. Received instructions for my participation in the procedure from the performing physician.

## 2024-03-11 NOTE — Progress Notes (Signed)
 History and Physical:  This patient presents for endoscopic testing for: Encounter Diagnosis  Name Primary?   Special screening for malignant neoplasm of colon Yes    Average risk for colorectal cancer.  1st screening exam.  Incidental chronic constipation for which she has been seen previously in the office and treated with Linzess .  Patient is otherwise without complaints or active issues today.   Past Medical History: Past Medical History:  Diagnosis Date   ABNORMAL PAP SMEAR 03/08/2008   ARTHRITIS 03/09/2008   ASTHMA 03/09/2008   COLPOSCOPY, HX OF 03/08/2008   Depression    Diabetes (HCC)    DM 12/08/2008   Gallstones    Hypercholesteremia    Irregular menstrual cycle 03/08/2008   Obesity      Past Surgical History: Past Surgical History:  Procedure Laterality Date   BACK SURGERY     CHOLECYSTECTOMY     INNER EAR SURGERY     x6, childhood before age 40   TONSILLECTOMY      Allergies: Allergies  Allergen Reactions   Amoxicillin Other (See Comments)    Childhood allergy- reaction unknown   Azithromycin Other (See Comments)    Childhood allergy- reaction unknown    Barbiturates Other (See Comments)    Childhood allergy- reaction unknown    Erythromycin Other (See Comments)    Childhood allergy- reaction unknown    Penicillins Other (See Comments)    Childhood allergy- reaction unknown    Pentobarbital Other (See Comments)    Childhood allergy- reaction unknown    Sulfamethoxazole-Trimethoprim Other (See Comments)    Childhood allergy- reaction unknown    Sulfonamide Derivatives Other (See Comments)    Childhood allergy- reaction unknown     Outpatient Meds: Current Outpatient Medications  Medication Sig Dispense Refill   amLODipine (NORVASC) 5 MG tablet Take by mouth.     atorvastatin  (LIPITOR) 40 MG tablet Take 1 tablet by mouth daily.     buPROPion (WELLBUTRIN XL) 300 MG 24 hr tablet TAKE 1 TABLET BY MOUTH ONCE DAILY FOR 90 DAYS     CAMRESE  0.15-0.03 &0.01 MG tablet      cetirizine (ZYRTEC) 10 MG tablet Take 1 tablet by mouth at bedtime.     DULoxetine (CYMBALTA) 60 MG capsule Take 2 capsules by mouth at bedtime.     famotidine (PEPCID) 20 MG tablet Take 1 tablet by mouth every morning.     glimepiride (AMARYL) 4 MG tablet Take by mouth.     hydrochlorothiazide (HYDRODIURIL) 25 MG tablet Take 25 mg by mouth daily.     insulin  glargine (LANTUS) 100 UNIT/ML injection Inject 40 Units into the skin daily.     levothyroxine (SYNTHROID) 25 MCG tablet Take 1 tablet by mouth daily.     losartan (COZAAR) 100 MG tablet Take 1 tablet by mouth at bedtime.     metFORMIN  (GLUCOPHAGE ) 1000 MG tablet Take by mouth.     montelukast (SINGULAIR) 10 MG tablet Take 1 tablet by mouth daily.     OMEPRAZOLE PO Take by mouth.     polyethylene glycol powder (MIRALAX) 17 GM/SCOOP powder Take by mouth daily.     sitaGLIPtin (JANUVIA) 100 MG tablet Take by mouth.     traZODone (DESYREL) 50 MG tablet Take 1 tablet by mouth at bedtime.     acetaminophen (TYLENOL) 325 MG tablet Take by mouth.     atorvastatin  (LIPITOR) 10 MG tablet TAKE ONE TABLET BY MOUTH ONCE DAILY (Patient not taking: Reported on 07/02/2022) 90  tablet 0   budesonide-formoterol (SYMBICORT) 160-4.5 MCG/ACT inhaler 2 puffs, inhale, BID, # 6 g, 5 Refill(s), Pharmacy: [facility] [phone], 170, cm, 02/16/21 8:33:00 EST, Height/Length Measured (Patient not taking: Reported on 06/21/2022)     celecoxib (CELEBREX) 200 MG capsule Take by mouth. (Patient not taking: Reported on 06/21/2022)     cephALEXin (KEFLEX) 500 MG capsule Take 1 capsule 3 times a day by oral route as directed for 10 days.     FEROSUL 325 (65 Fe) MG tablet Take 325 mg by mouth daily.     gabapentin  (NEURONTIN ) 300 MG capsule Take 1 capsule (300 mg total) by mouth 3 (three) times daily. (Patient not taking: Reported on 07/02/2022) 30 capsule 11   glucose blood (RELION GLUCOSE TEST STRIPS) test strip Two times a day dx  250.00      ibuprofen (ADVIL) 800 MG tablet Take 800 mg by mouth. Daily     insulin  NPH-regular Human (NOVOLIN 70/30) (70-30) 100 UNIT/ML injection 30 units with breakfast, and 15 units with supper, and syringes 2/day (Patient not taking: Reported on 06/21/2022) 20 mL 11   JARDIANCE 10 MG TABS tablet Take 10 mg by mouth daily.     linaclotide  (LINZESS ) 145 MCG CAPS capsule Take 1 capsule (145 mcg total) by mouth daily. NEED APPOINTMENT FOR ADDITIONAL REFILLS 30 capsule 0   metoCLOPramide  (REGLAN ) 5 MG tablet Take 1-2 tablets (5 mg -10 mg total) 3 times daily before meals and at bedtime PRN (Patient not taking: Reported on 02/08/2023) 240 tablet 0   MOUNJARO 12.5 MG/0.5ML Pen Inject 12.5 mg into the skin once a week.     norethindrone (MICRONOR) 0.35 MG tablet Take 1 tablet by mouth daily.     saxagliptin  HCl (ONGLYZA) 5 MG TABS tablet Take 5 mg by mouth daily.     senna (SENOKOT) 8.6 MG tablet Take 1 tablet by mouth 2 (two) times daily.     tiZANidine (ZANAFLEX) 4 MG tablet TAKE 1 TABLET BY MOUTH EVERY 8 HOURS AS NEEDED FOR 30 DAYS     Current Facility-Administered Medications  Medication Dose Route Frequency Provider Last Rate Last Admin   0.9 %  sodium chloride  infusion  500 mL Intravenous Once Danis, Victory CROME III, MD          ___________________________________________________________________ Objective   Exam:  BP 106/76   Pulse 82   Temp (!) 97 F (36.1 C)   Ht 5' 5 (1.651 m)   Wt 238 lb (108 kg)   LMP 01/08/2024 (Approximate)   SpO2 99%   BMI 39.61 kg/m   CV: regular , S1/S2 Resp: clear to auscultation bilaterally, normal RR and effort noted GI: soft, no tenderness, with active bowel sounds.   Assessment: Encounter Diagnosis  Name Primary?   Special screening for malignant neoplasm of colon Yes     Plan: Colonoscopy   The benefits and risks of the planned procedure(s) were described in detail with the patient or (when appropriate) their health care proxy.  Risks  were outlined as including, but not limited to, bleeding, infection, perforation, adverse medication reaction leading to cardiac or pulmonary decompensation, pancreatitis (if ERCP).  The limitation of incomplete mucosal visualization was also discussed.  No guarantees or warranties were given.  The patient was provided an opportunity to ask questions and all were answered. The patient agreed with the plan.   The patient is appropriate for an endoscopic procedure in the ambulatory setting.   - Victory Brand, MD

## 2024-03-12 ENCOUNTER — Telehealth: Payer: Self-pay | Admitting: Lactation Services

## 2024-03-12 NOTE — Telephone Encounter (Signed)
 No answer - mailbox full.

## 2024-03-13 LAB — SURGICAL PATHOLOGY

## 2024-03-15 ENCOUNTER — Ambulatory Visit: Payer: Self-pay | Admitting: Gastroenterology
